# Patient Record
Sex: Female | Born: 1975 | Race: Black or African American | Hispanic: No | Marital: Single | Smoking: Never smoker
Health system: Southern US, Community
[De-identification: ages and names within clinical notes are randomized; demographics above are authoritative.]

## PROBLEM LIST (undated history)

## (undated) DIAGNOSIS — N920 Excessive and frequent menstruation with regular cycle: Secondary | ICD-10-CM

## (undated) DIAGNOSIS — O009 Unspecified ectopic pregnancy without intrauterine pregnancy: Secondary | ICD-10-CM

## (undated) DIAGNOSIS — R896 Abnormal cytological findings in specimens from other organs, systems and tissues: Secondary | ICD-10-CM

## (undated) DIAGNOSIS — D509 Iron deficiency anemia, unspecified: Secondary | ICD-10-CM

## (undated) DIAGNOSIS — A599 Trichomoniasis, unspecified: Secondary | ICD-10-CM

## (undated) HISTORY — PX: UNILATERAL SALPINGECTOMY: SHX6160

## (undated) HISTORY — DX: Trichomoniasis, unspecified: A59.9

## (undated) HISTORY — DX: Unspecified ectopic pregnancy without intrauterine pregnancy: O00.90

## (undated) HISTORY — PX: OTHER SURGICAL HISTORY: SHX169

## (undated) HISTORY — DX: Iron deficiency anemia, unspecified: D50.9

## (undated) HISTORY — DX: Abnormal cytological findings in specimens from other organs, systems and tissues: R89.6

## (undated) HISTORY — DX: Excessive and frequent menstruation with regular cycle: N92.0

---

## 1997-07-02 DIAGNOSIS — IMO0001 Reserved for inherently not codable concepts without codable children: Secondary | ICD-10-CM

## 1997-07-02 HISTORY — DX: Reserved for inherently not codable concepts without codable children: IMO0001

## 1997-08-25 ENCOUNTER — Inpatient Hospital Stay (HOSPITAL_COMMUNITY): Admission: AD | Admit: 1997-08-25 | Discharge: 1997-08-26 | Payer: Self-pay | Admitting: *Deleted

## 1997-10-05 ENCOUNTER — Encounter: Admission: RE | Admit: 1997-10-05 | Discharge: 1997-10-05 | Payer: Self-pay | Admitting: Obstetrics & Gynecology

## 1997-10-18 ENCOUNTER — Ambulatory Visit (HOSPITAL_COMMUNITY): Admission: RE | Admit: 1997-10-18 | Discharge: 1997-10-18 | Payer: Self-pay | Admitting: Obstetrics & Gynecology

## 1997-11-08 ENCOUNTER — Encounter: Admission: RE | Admit: 1997-11-08 | Discharge: 1997-11-08 | Payer: Self-pay | Admitting: Family Medicine

## 1997-11-16 ENCOUNTER — Encounter: Admission: RE | Admit: 1997-11-16 | Discharge: 1997-11-16 | Payer: Self-pay | Admitting: Family Medicine

## 1998-03-21 ENCOUNTER — Encounter: Admission: RE | Admit: 1998-03-21 | Discharge: 1998-03-21 | Payer: Self-pay | Admitting: Family Medicine

## 1998-03-28 ENCOUNTER — Encounter: Admission: RE | Admit: 1998-03-28 | Discharge: 1998-03-28 | Payer: Self-pay | Admitting: Family Medicine

## 1998-06-10 ENCOUNTER — Other Ambulatory Visit: Admission: RE | Admit: 1998-06-10 | Discharge: 1998-06-10 | Payer: Self-pay

## 1998-06-10 ENCOUNTER — Encounter: Admission: RE | Admit: 1998-06-10 | Discharge: 1998-06-10 | Payer: Self-pay | Admitting: Family Medicine

## 1998-07-02 DIAGNOSIS — O009 Unspecified ectopic pregnancy without intrauterine pregnancy: Secondary | ICD-10-CM

## 1998-07-02 HISTORY — DX: Unspecified ectopic pregnancy without intrauterine pregnancy: O00.90

## 1999-02-06 ENCOUNTER — Emergency Department (HOSPITAL_COMMUNITY): Admission: EM | Admit: 1999-02-06 | Discharge: 1999-02-06 | Payer: Self-pay | Admitting: Emergency Medicine

## 1999-09-05 ENCOUNTER — Inpatient Hospital Stay (HOSPITAL_COMMUNITY): Admission: AD | Admit: 1999-09-05 | Discharge: 1999-09-08 | Payer: Self-pay | Admitting: Obstetrics and Gynecology

## 1999-09-05 ENCOUNTER — Encounter: Payer: Self-pay | Admitting: Emergency Medicine

## 1999-09-05 ENCOUNTER — Encounter (INDEPENDENT_AMBULATORY_CARE_PROVIDER_SITE_OTHER): Payer: Self-pay

## 2000-04-29 ENCOUNTER — Encounter: Admission: RE | Admit: 2000-04-29 | Discharge: 2000-04-29 | Payer: Self-pay | Admitting: Family Medicine

## 2000-12-03 ENCOUNTER — Encounter: Admission: RE | Admit: 2000-12-03 | Discharge: 2000-12-03 | Payer: Self-pay | Admitting: Family Medicine

## 2001-07-02 HISTORY — PX: TUBAL LIGATION: SHX77

## 2001-07-02 HISTORY — PX: OTHER SURGICAL HISTORY: SHX169

## 2001-11-26 ENCOUNTER — Encounter: Admission: RE | Admit: 2001-11-26 | Discharge: 2001-11-26 | Payer: Self-pay | Admitting: Family Medicine

## 2001-12-03 ENCOUNTER — Encounter: Admission: RE | Admit: 2001-12-03 | Discharge: 2001-12-03 | Payer: Self-pay | Admitting: Family Medicine

## 2001-12-08 ENCOUNTER — Encounter: Admission: RE | Admit: 2001-12-08 | Discharge: 2001-12-08 | Payer: Self-pay | Admitting: Family Medicine

## 2001-12-19 ENCOUNTER — Ambulatory Visit (HOSPITAL_COMMUNITY): Admission: RE | Admit: 2001-12-19 | Discharge: 2001-12-19 | Payer: Self-pay | Admitting: Family Medicine

## 2001-12-19 ENCOUNTER — Encounter: Admission: RE | Admit: 2001-12-19 | Discharge: 2001-12-19 | Payer: Self-pay | Admitting: Family Medicine

## 2002-01-06 ENCOUNTER — Encounter: Admission: RE | Admit: 2002-01-06 | Discharge: 2002-01-06 | Payer: Self-pay | Admitting: Family Medicine

## 2002-01-14 ENCOUNTER — Ambulatory Visit (HOSPITAL_COMMUNITY): Admission: RE | Admit: 2002-01-14 | Discharge: 2002-01-14 | Payer: Self-pay | Admitting: Family Medicine

## 2002-02-06 ENCOUNTER — Encounter: Admission: RE | Admit: 2002-02-06 | Discharge: 2002-02-06 | Payer: Self-pay | Admitting: Family Medicine

## 2002-03-09 ENCOUNTER — Encounter: Admission: RE | Admit: 2002-03-09 | Discharge: 2002-03-09 | Payer: Self-pay | Admitting: Family Medicine

## 2002-04-16 ENCOUNTER — Encounter: Admission: RE | Admit: 2002-04-16 | Discharge: 2002-04-16 | Payer: Self-pay | Admitting: Family Medicine

## 2002-05-01 ENCOUNTER — Encounter: Admission: RE | Admit: 2002-05-01 | Discharge: 2002-05-01 | Payer: Self-pay | Admitting: Family Medicine

## 2002-05-11 ENCOUNTER — Encounter: Admission: RE | Admit: 2002-05-11 | Discharge: 2002-05-11 | Payer: Self-pay | Admitting: Family Medicine

## 2002-05-26 ENCOUNTER — Encounter: Admission: RE | Admit: 2002-05-26 | Discharge: 2002-05-26 | Payer: Self-pay | Admitting: Family Medicine

## 2002-06-03 ENCOUNTER — Encounter: Admission: RE | Admit: 2002-06-03 | Discharge: 2002-06-03 | Payer: Self-pay | Admitting: Family Medicine

## 2002-06-11 ENCOUNTER — Encounter: Admission: RE | Admit: 2002-06-11 | Discharge: 2002-06-11 | Payer: Self-pay | Admitting: Family Medicine

## 2002-06-12 ENCOUNTER — Inpatient Hospital Stay (HOSPITAL_COMMUNITY): Admission: AD | Admit: 2002-06-12 | Discharge: 2002-06-12 | Payer: Self-pay | Admitting: Family Medicine

## 2002-06-15 ENCOUNTER — Inpatient Hospital Stay (HOSPITAL_COMMUNITY): Admission: AD | Admit: 2002-06-15 | Discharge: 2002-06-17 | Payer: Self-pay | Admitting: Family Medicine

## 2002-06-16 ENCOUNTER — Encounter (INDEPENDENT_AMBULATORY_CARE_PROVIDER_SITE_OTHER): Payer: Self-pay | Admitting: Specialist

## 2002-08-07 ENCOUNTER — Encounter: Admission: RE | Admit: 2002-08-07 | Discharge: 2002-08-07 | Payer: Self-pay | Admitting: Family Medicine

## 2003-07-03 DIAGNOSIS — N920 Excessive and frequent menstruation with regular cycle: Secondary | ICD-10-CM

## 2003-07-03 HISTORY — DX: Excessive and frequent menstruation with regular cycle: N92.0

## 2003-07-23 ENCOUNTER — Encounter: Admission: RE | Admit: 2003-07-23 | Discharge: 2003-07-23 | Payer: Self-pay | Admitting: Family Medicine

## 2003-07-27 ENCOUNTER — Encounter (INDEPENDENT_AMBULATORY_CARE_PROVIDER_SITE_OTHER): Payer: Self-pay | Admitting: *Deleted

## 2003-07-27 ENCOUNTER — Encounter: Admission: RE | Admit: 2003-07-27 | Discharge: 2003-07-27 | Payer: Self-pay | Admitting: Family Medicine

## 2003-07-27 ENCOUNTER — Other Ambulatory Visit: Admission: RE | Admit: 2003-07-27 | Discharge: 2003-07-27 | Payer: Self-pay | Admitting: Family Medicine

## 2003-08-11 ENCOUNTER — Encounter: Admission: RE | Admit: 2003-08-11 | Discharge: 2003-08-11 | Payer: Self-pay | Admitting: Family Medicine

## 2003-10-28 ENCOUNTER — Emergency Department (HOSPITAL_COMMUNITY): Admission: EM | Admit: 2003-10-28 | Discharge: 2003-10-28 | Payer: Self-pay

## 2004-01-17 ENCOUNTER — Encounter: Admission: RE | Admit: 2004-01-17 | Discharge: 2004-01-17 | Payer: Self-pay | Admitting: Family Medicine

## 2004-02-02 ENCOUNTER — Encounter: Admission: RE | Admit: 2004-02-02 | Discharge: 2004-02-02 | Payer: Self-pay | Admitting: Sports Medicine

## 2004-08-28 ENCOUNTER — Ambulatory Visit: Payer: Self-pay | Admitting: Sports Medicine

## 2004-10-26 ENCOUNTER — Ambulatory Visit: Payer: Self-pay | Admitting: Family Medicine

## 2005-01-24 ENCOUNTER — Ambulatory Visit: Payer: Self-pay | Admitting: Family Medicine

## 2005-01-25 ENCOUNTER — Encounter: Admission: RE | Admit: 2005-01-25 | Discharge: 2005-01-25 | Payer: Self-pay | Admitting: Sports Medicine

## 2005-03-06 ENCOUNTER — Ambulatory Visit: Payer: Self-pay | Admitting: Family Medicine

## 2005-06-20 ENCOUNTER — Ambulatory Visit: Payer: Self-pay | Admitting: Family Medicine

## 2005-08-31 ENCOUNTER — Encounter (INDEPENDENT_AMBULATORY_CARE_PROVIDER_SITE_OTHER): Payer: Self-pay | Admitting: Specialist

## 2005-08-31 ENCOUNTER — Other Ambulatory Visit: Admission: RE | Admit: 2005-08-31 | Discharge: 2005-08-31 | Payer: Self-pay | Admitting: Family Medicine

## 2005-08-31 ENCOUNTER — Ambulatory Visit: Payer: Self-pay | Admitting: Sports Medicine

## 2005-10-03 ENCOUNTER — Ambulatory Visit: Payer: Self-pay | Admitting: Family Medicine

## 2005-12-25 ENCOUNTER — Ambulatory Visit: Payer: Self-pay | Admitting: Family Medicine

## 2005-12-25 ENCOUNTER — Encounter (INDEPENDENT_AMBULATORY_CARE_PROVIDER_SITE_OTHER): Payer: Self-pay | Admitting: Specialist

## 2006-01-09 ENCOUNTER — Ambulatory Visit: Payer: Self-pay | Admitting: Family Medicine

## 2006-01-14 ENCOUNTER — Ambulatory Visit: Payer: Self-pay | Admitting: Family Medicine

## 2006-01-21 ENCOUNTER — Ambulatory Visit: Payer: Self-pay | Admitting: Family Medicine

## 2006-05-18 ENCOUNTER — Emergency Department (HOSPITAL_COMMUNITY): Admission: EM | Admit: 2006-05-18 | Discharge: 2006-05-18 | Payer: Self-pay | Admitting: Emergency Medicine

## 2006-05-20 ENCOUNTER — Ambulatory Visit: Payer: Self-pay | Admitting: Sports Medicine

## 2006-07-02 DIAGNOSIS — A599 Trichomoniasis, unspecified: Secondary | ICD-10-CM

## 2006-07-02 HISTORY — DX: Trichomoniasis, unspecified: A59.9

## 2006-07-29 ENCOUNTER — Other Ambulatory Visit: Admission: RE | Admit: 2006-07-29 | Discharge: 2006-07-29 | Payer: Self-pay | Admitting: Family Medicine

## 2006-07-29 ENCOUNTER — Ambulatory Visit: Payer: Self-pay | Admitting: Family Medicine

## 2006-08-09 ENCOUNTER — Encounter (INDEPENDENT_AMBULATORY_CARE_PROVIDER_SITE_OTHER): Payer: Self-pay | Admitting: *Deleted

## 2006-08-09 LAB — CONVERTED CEMR LAB

## 2006-08-29 DIAGNOSIS — D509 Iron deficiency anemia, unspecified: Secondary | ICD-10-CM | POA: Insufficient documentation

## 2006-08-29 DIAGNOSIS — N920 Excessive and frequent menstruation with regular cycle: Secondary | ICD-10-CM

## 2006-08-29 HISTORY — DX: Excessive and frequent menstruation with regular cycle: N92.0

## 2006-08-29 HISTORY — DX: Iron deficiency anemia, unspecified: D50.9

## 2006-08-30 ENCOUNTER — Encounter (INDEPENDENT_AMBULATORY_CARE_PROVIDER_SITE_OTHER): Payer: Self-pay | Admitting: *Deleted

## 2006-10-15 ENCOUNTER — Encounter: Payer: Self-pay | Admitting: Family Medicine

## 2006-10-15 ENCOUNTER — Ambulatory Visit: Payer: Self-pay | Admitting: Family Medicine

## 2006-10-15 LAB — CONVERTED CEMR LAB
HCT: 30.8 %
Hemoglobin: 9.6 g/dL
MCV: 63.8 fL
Platelets: 328 10*3/uL
RBC: 4.83 M/uL
WBC: 5.4 10*3/uL
Whiff Test: NEGATIVE

## 2006-10-16 ENCOUNTER — Encounter: Payer: Self-pay | Admitting: Family Medicine

## 2006-11-21 ENCOUNTER — Emergency Department (HOSPITAL_COMMUNITY): Admission: EM | Admit: 2006-11-21 | Discharge: 2006-11-21 | Payer: Self-pay | Admitting: Emergency Medicine

## 2006-12-16 ENCOUNTER — Ambulatory Visit: Payer: Self-pay | Admitting: Family Medicine

## 2007-01-09 ENCOUNTER — Telehealth: Payer: Self-pay | Admitting: *Deleted

## 2007-03-06 IMAGING — CT CT ABDOMEN W/ CM
1 of 2 series · 15 of 32 positions shown, 19 images · IV contrast (GASTROGRAFIN & BAROCAT & WATER)
Comparison: none

CLINICAL DATA: Epigastric pain.  Nausea and vomiting for six weeks.  Question pancreatitis. 
ABDOMINAL CT WITH CONTRAST:
TECHNIQUE: Multidetector CT imaging of the abdomen was performed following the standard protocol during bolus administration of intravenous contrast.
Contrast:  100 cc Omnipaque 300
Comparison [REDACTED] lumbar spine radiographs, 10/28/03. 
The abdominal parenchymal organs are normal in appearance. There is no evidence of masses, adenopathy, inflammatory process, or abnormal fluid collections.  Pancreatic measurements include 2.6 cm AP head, 2.1 cm AP proximal body and AP tail of 2.8 cm (images 28, 24, 20).  No CT evidence for pancreatitis is seen.
Moderate retained colonic feces suggests constipation.
TECHNIQUE: Multidetector CT imaging of the pelvis was performed following the standard protocol during bolus administration of intravenous contrast.

[Series 2: a&p w/ · axial · 0.55mm/px · z∈[-401,-26]mm · 15 of 83 slices shown, 19 images]
[im 4/83  soft-tissue]
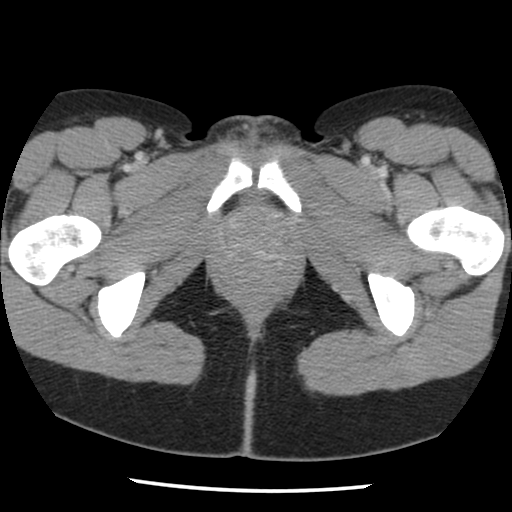
[im 4/83  bone]
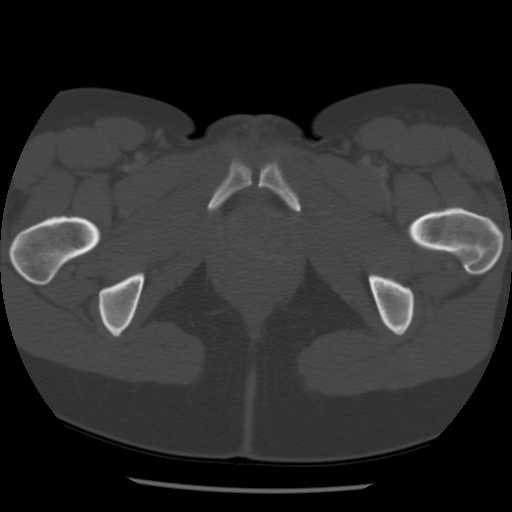
[im 11/83  soft-tissue]
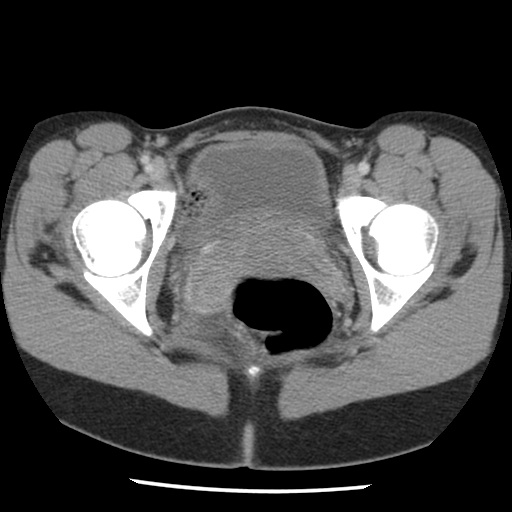
[im 18/83  soft-tissue]
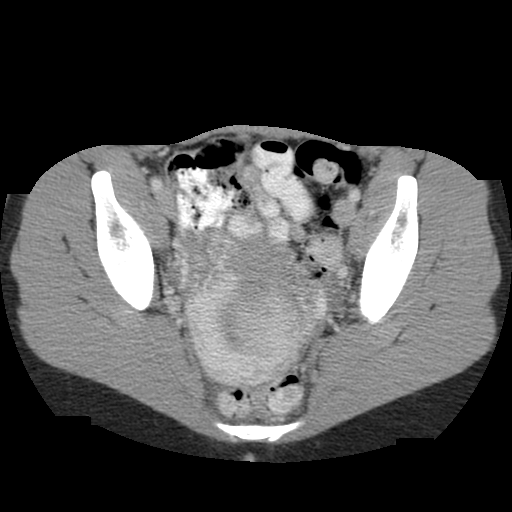
[im 22/83  soft-tissue]
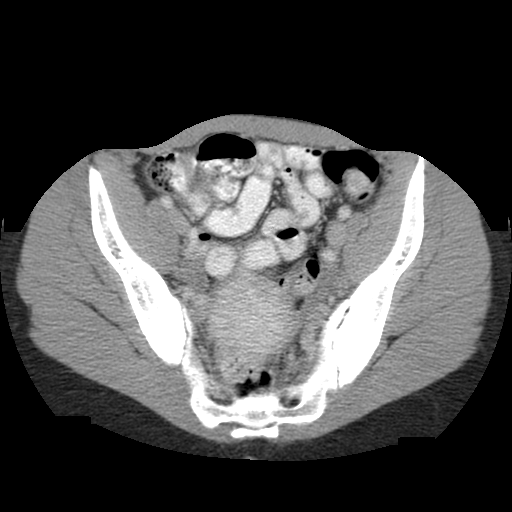
[im 29/83  soft-tissue]
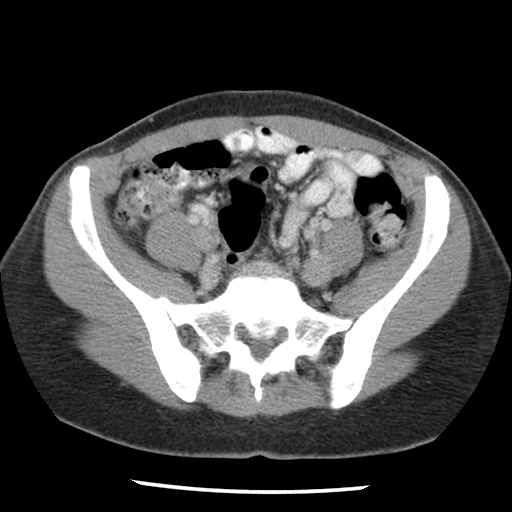
[im 36/83  soft-tissue]
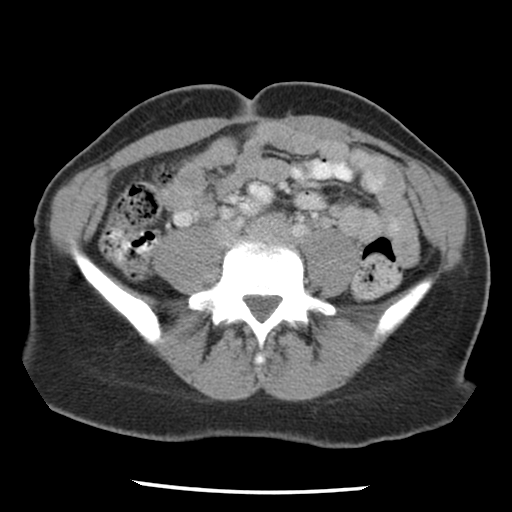
[im 43/83  soft-tissue]
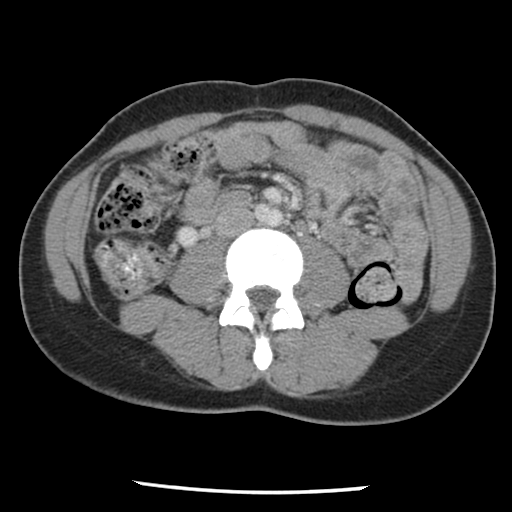
[im 47/83  soft-tissue]
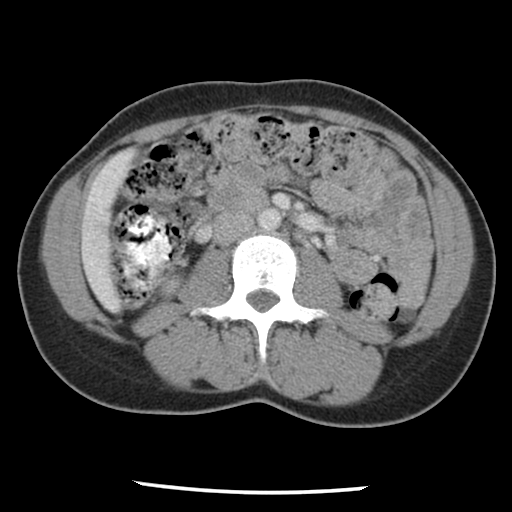
[im 54/83  soft-tissue]
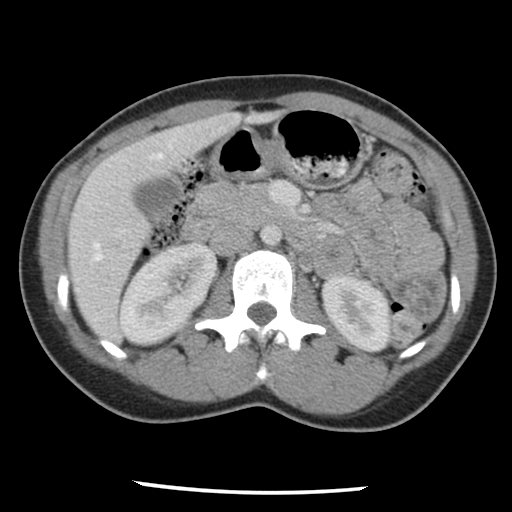
[im 54/83  bone]
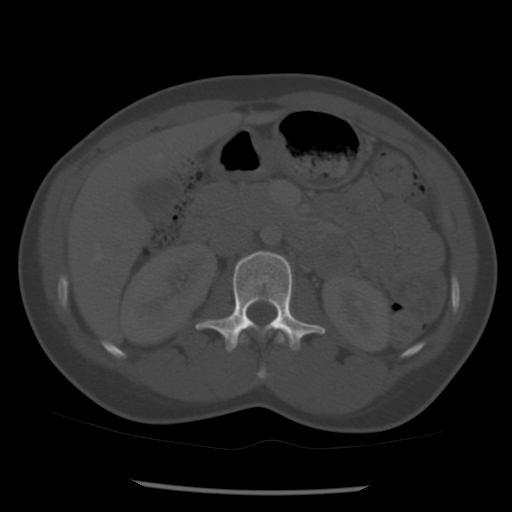
[im 61/83  soft-tissue]
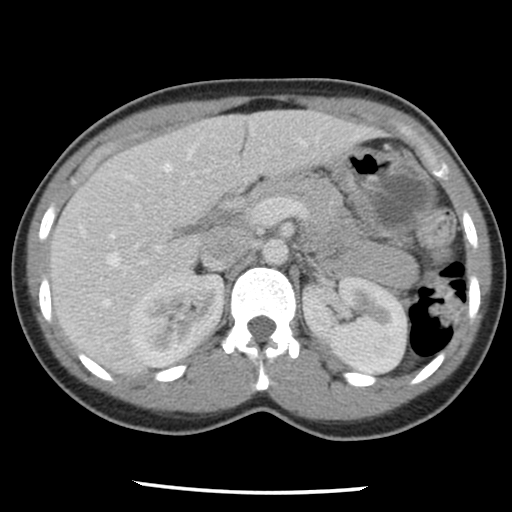
[im 65/83  soft-tissue]
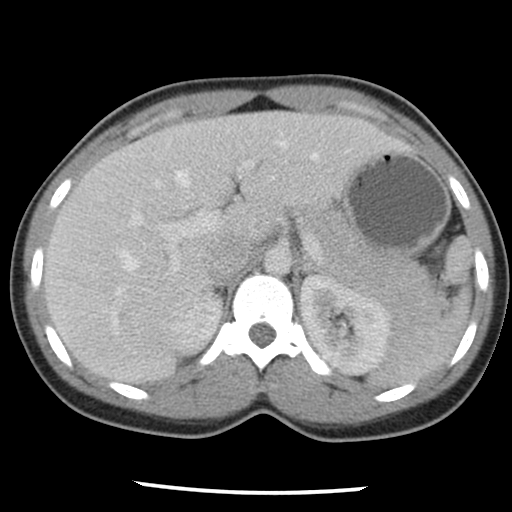
[im 68/83  lung]
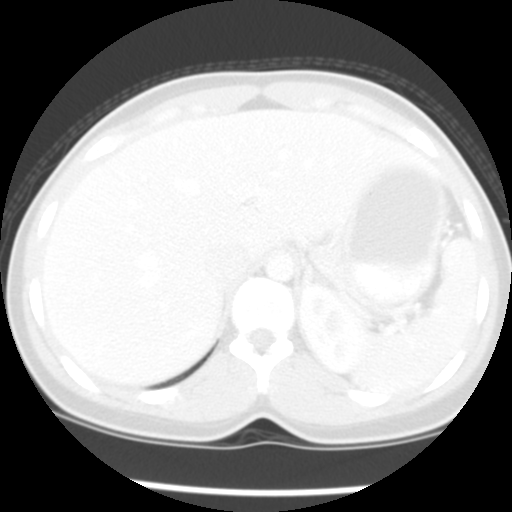
[im 72/83  soft-tissue]
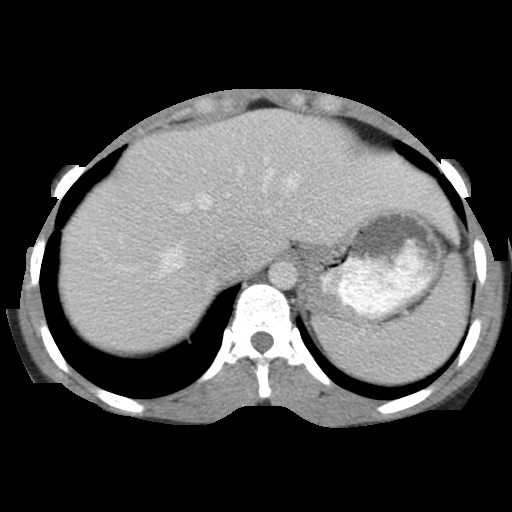
[im 72/83  lung]
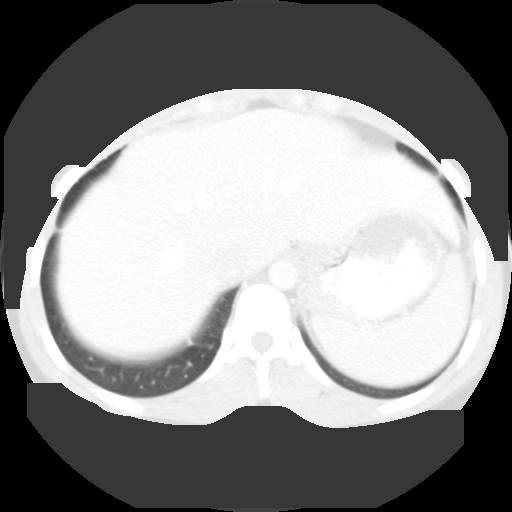
[im 75/83  lung]
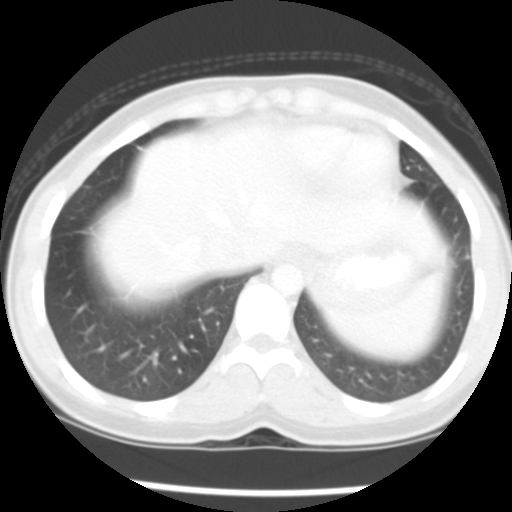
[im 79/83  soft-tissue]
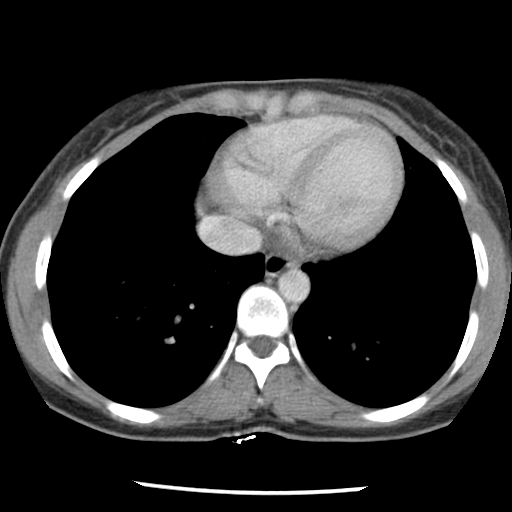
[im 79/83  lung]
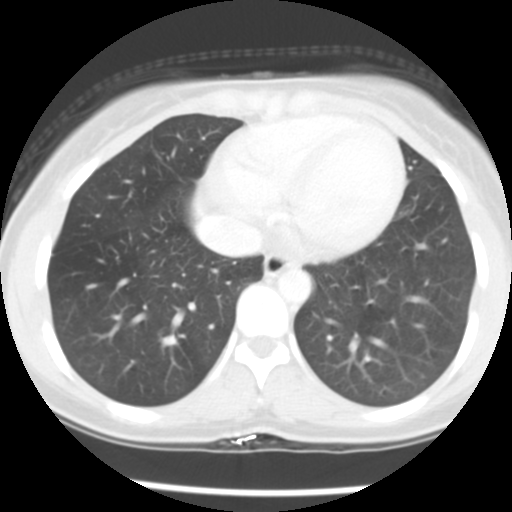

[15 of 32 positions shown; findings below may reference images not displayed]

IMPRESSION: 1.  Probable constipation.
2.  Otherwise negative ? no CT evidence for pancreatitis.
PELVIS CT WITH CONTRAST:
FINDINGS: Retroverted uterus is seen with the prominence of the endometrium probably related to phase of menstrual cycle.   Possible subtle fundal fibroid changes are seen.  Slight cul de sac free fluid is noted (consistent with ovulation or leaking/ruptured ovarian cyst) with no significant adnexal cystic masses by CT assessment.  Moderate retained colonic feces is consistent with constipation.  Normal appearing air-filled appendix is seen with the remaining pelvic structures appearing normal without adenopathy.
IMPRESSION: 1.  Retroverted uterus with prominent endometrial contents ? probably related to phase of menstrual cycle with possible subtle fundal fibroid changes and slight cul de sac free fluid (see findings).  Pelvic ultrasound may be contributory if clinically indicated. 
2.  Probable constipation.  
3.  Otherwise negative.

## 2007-03-26 ENCOUNTER — Telehealth (INDEPENDENT_AMBULATORY_CARE_PROVIDER_SITE_OTHER): Payer: Self-pay | Admitting: *Deleted

## 2007-03-26 ENCOUNTER — Ambulatory Visit: Payer: Self-pay | Admitting: Family Medicine

## 2007-03-26 ENCOUNTER — Encounter (INDEPENDENT_AMBULATORY_CARE_PROVIDER_SITE_OTHER): Payer: Self-pay | Admitting: *Deleted

## 2007-03-26 LAB — CONVERTED CEMR LAB: Whiff Test: POSITIVE

## 2007-03-27 LAB — CONVERTED CEMR LAB
Chlamydia, DNA Probe: NEGATIVE
GC Probe Amp, Genital: NEGATIVE

## 2007-04-07 ENCOUNTER — Telehealth: Payer: Self-pay | Admitting: *Deleted

## 2007-09-22 ENCOUNTER — Emergency Department (HOSPITAL_COMMUNITY): Admission: EM | Admit: 2007-09-22 | Discharge: 2007-09-22 | Payer: Self-pay | Admitting: Emergency Medicine

## 2007-10-14 ENCOUNTER — Telehealth (INDEPENDENT_AMBULATORY_CARE_PROVIDER_SITE_OTHER): Payer: Self-pay | Admitting: Family Medicine

## 2007-10-16 ENCOUNTER — Telehealth: Payer: Self-pay | Admitting: Internal Medicine

## 2007-10-20 ENCOUNTER — Telehealth (INDEPENDENT_AMBULATORY_CARE_PROVIDER_SITE_OTHER): Payer: Self-pay | Admitting: Family Medicine

## 2008-01-06 ENCOUNTER — Ambulatory Visit: Payer: Self-pay | Admitting: Family Medicine

## 2008-01-06 ENCOUNTER — Encounter: Payer: Self-pay | Admitting: Family Medicine

## 2008-01-06 LAB — CONVERTED CEMR LAB: Pap Smear: NORMAL

## 2008-01-07 LAB — CONVERTED CEMR LAB
Eosinophils Absolute: 0.1 10*3/uL (ref 0.0–0.7)
Lymphocytes Relative: 33 % (ref 12–46)
Lymphs Abs: 1.8 10*3/uL (ref 0.7–4.0)
Neutro Abs: 3 10*3/uL (ref 1.7–7.7)
Neutrophils Relative %: 56 % (ref 43–77)
Platelets: 276 10*3/uL (ref 150–400)
WBC: 5.3 10*3/uL (ref 4.0–10.5)

## 2008-01-11 ENCOUNTER — Encounter: Payer: Self-pay | Admitting: Family Medicine

## 2008-07-16 ENCOUNTER — Encounter: Payer: Self-pay | Admitting: Family Medicine

## 2008-07-16 ENCOUNTER — Ambulatory Visit: Payer: Self-pay | Admitting: Family Medicine

## 2008-07-16 LAB — CONVERTED CEMR LAB: Rapid Strep: POSITIVE

## 2008-07-19 ENCOUNTER — Encounter: Payer: Self-pay | Admitting: Family Medicine

## 2008-07-19 LAB — CONVERTED CEMR LAB
Hemoglobin: 10.1 g/dL — ABNORMAL LOW (ref 12.0–15.0)
Iron: 12 ug/dL — ABNORMAL LOW (ref 42–145)
MCHC: 30.1 g/dL (ref 30.0–36.0)
RDW: 18 % — ABNORMAL HIGH (ref 11.5–15.5)
TIBC: 399 ug/dL (ref 250–470)
UIBC: 387 ug/dL

## 2008-10-28 ENCOUNTER — Telehealth: Payer: Self-pay | Admitting: *Deleted

## 2008-12-12 ENCOUNTER — Emergency Department (HOSPITAL_COMMUNITY): Admission: EM | Admit: 2008-12-12 | Discharge: 2008-12-12 | Payer: Self-pay | Admitting: Emergency Medicine

## 2008-12-20 ENCOUNTER — Telehealth: Payer: Self-pay | Admitting: Family Medicine

## 2008-12-21 ENCOUNTER — Ambulatory Visit: Payer: Self-pay | Admitting: Family Medicine

## 2008-12-21 ENCOUNTER — Encounter (INDEPENDENT_AMBULATORY_CARE_PROVIDER_SITE_OTHER): Payer: Self-pay | Admitting: *Deleted

## 2008-12-21 LAB — CONVERTED CEMR LAB: Beta hcg, urine, semiquantitative: NEGATIVE

## 2009-03-24 ENCOUNTER — Ambulatory Visit: Payer: Self-pay | Admitting: Family Medicine

## 2009-03-24 ENCOUNTER — Encounter: Payer: Self-pay | Admitting: Family Medicine

## 2009-03-28 ENCOUNTER — Encounter: Payer: Self-pay | Admitting: Family Medicine

## 2009-03-28 LAB — CONVERTED CEMR LAB
MCHC: 30.4 g/dL (ref 30.0–36.0)
MCV: 70.1 fL — ABNORMAL LOW (ref 78.0–100.0)
RBC: 4.51 M/uL (ref 3.87–5.11)

## 2010-06-19 ENCOUNTER — Telehealth (INDEPENDENT_AMBULATORY_CARE_PROVIDER_SITE_OTHER): Payer: Self-pay | Admitting: *Deleted

## 2010-07-14 ENCOUNTER — Ambulatory Visit: Admission: RE | Admit: 2010-07-14 | Discharge: 2010-07-14 | Payer: Self-pay | Source: Home / Self Care

## 2010-07-14 ENCOUNTER — Encounter: Payer: Self-pay | Admitting: Family Medicine

## 2010-07-14 LAB — CONVERTED CEMR LAB
Basophils Absolute: 0.1 10*3/uL (ref 0.0–0.1)
Basophils Relative: 1 % (ref 0–1)
Eosinophils Absolute: 0.1 10*3/uL (ref 0.0–0.7)
Lymphs Abs: 2.3 10*3/uL (ref 0.7–4.0)
MCHC: 31.8 g/dL (ref 30.0–36.0)
MCV: 73.5 fL — ABNORMAL LOW (ref 78.0–100.0)
Neutrophils Relative %: 54 % (ref 43–77)
Platelets: 234 10*3/uL (ref 150–400)
WBC: 6.3 10*3/uL (ref 4.0–10.5)

## 2010-07-18 ENCOUNTER — Encounter: Payer: Self-pay | Admitting: Family Medicine

## 2010-07-21 ENCOUNTER — Ambulatory Visit: Admission: RE | Admit: 2010-07-21 | Discharge: 2010-07-21 | Payer: Self-pay | Source: Home / Self Care

## 2010-07-25 ENCOUNTER — Telehealth: Payer: Self-pay | Admitting: Family Medicine

## 2010-08-03 NOTE — Progress Notes (Signed)
Summary: refill   Phone Note Refill Request Call back at Home Phone [phone] Message from:  Patient  Refills Requested: Medication #1:  ORTHO TRI-CYCLEN (28) 0.035 MG  TABS take as directed. [facility] [address]  Next Appointment Scheduled: 07/14/10 Initial call taken by: De Nurse,  June 19, 2010 10:20 AM  Follow-up for Phone Call        Rx sent electronically. Follow-up by: Theresia Lo RN,  June 19, 2010 1:42 PM

## 2010-08-03 NOTE — Letter (Signed)
Summary: Results Follow-up Letter  [facility]  [address]   [place], [place] [address]   Phone: [phone]  Fax: [phone]    07/18/2010  [address] [place], [place]  [address]  Dear Ms. Orvan Falconer,   The following are the results of your recent test(s):  Tests: (1) CBC with Diff (10010)   WBC                       6.3 K/uL                    4.0-10.5   RBC                       4.87 MIL/uL                 3.87-5.11   Hemoglobin           [L]  11.4 g/dL                   57.8-46.9   Hematocrit           [L]  35.8 %                      36.0-46.0   MCV                  [L]  73.5 fL                     78.0-100.0 ! MCH                  [L]  23.4 pg                     26.0-34.0   MCHC                      31.8 g/dL                   62.9-52.8   RDW                  [H]  17.2 %                      11.5-15.5  This shows that your red blood cells are a little low, so you should continue your iron pills.  They are only a little low, though, so we do not need to do anything else right now.  Let me know if you have any concerns.  Sincerely,  Majel Homer MD [facility]           Appended Document: Results Follow-up Letter mailed

## 2010-08-03 NOTE — Assessment & Plan Note (Signed)
Summary: cpe,df   Vital Signs:  Patient profile:   35 year old female Height:      64 inches Weight:      124.9 pounds BMI:     21.52 Temp:     98.0 degrees F oral Pulse rate:   84 / minute BP sitting:   117 / 81  (left arm) Cuff size:   regular  Vitals Entered By: Jimmy Footman, CMA (July 14, 2010 2:58 PM) CC: cpe  w/o pap Is Patient Diabetic? No Pain Assessment Patient in pain? no        Primary Provider:  Majel Homer MD  CC:  cpe  w/o pap.  History of Present Illness: Pt doing well.  No active complaints.  Preventive Screening-Counseling & Management  Alcohol-Tobacco     Smoking Status: never  Current Problems (verified): 1)  Menorrhagia  (ICD-626.2) 2)  Anemia, Iron Deficiency, Unspec.  (ICD-280.9)  Current Medications (verified): 1)  Ortho Tri-Cyclen (28) 0.035 Mg  Tabs (Norgestimate-Ethinyl Estradiol) .... Take As Directed. 2)  Ferrous Sulfate 325 (65 Fe) Mg  Tabs (Ferrous Sulfate) .... Take One Tablet  Two Times A Day  Allergies (verified): No Known Drug Allergies  Past History:  Past Medical History: Last updated: 01/06/2008 1. Hx of ASCUS 1999 2. Hx of Ectopic pregnancy 2000 3. Menorrhagia- 2005 4. Hx of nl abd/pelvic CT 2006 5. Tichomonas 2008  Past Surgical History: Last updated: 01/06/2008 1. 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  Three kids ages 30, 69, 43 all boys.  No tobacco, occ etoh, no drugs. Sometimes exercises.  Risk Factors: Smoking Status: never (07/14/2010)  Family History: Reviewed history from 01/06/2008 and no changes required. 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  Three kids ages 108, 4, 81 all boys.  No tobacco, occ etoh, no drugs.  Sometimes exercises.  Review of Systems  The patient denies anorexia, fever, weight loss, weight gain, vision loss, decreased hearing, hoarseness, chest pain, syncope, dyspnea on exertion, peripheral edema, prolonged cough, headaches, hemoptysis, abdominal pain, melena, hematochezia, severe indigestion/heartburn, hematuria, muscle weakness, depression, unusual weight change, and abnormal bleeding.    Physical Exam  General:  Well Head:  Normocephalic and atraumatic without obvious abnormalities. No apparent alopecia or balding. Ears:  External ear exam shows no significant lesions or deformities.  Otoscopic examination reveals clear canals, tympanic membranes are intact bilaterally without bulging, retraction, inflammation or discharge. Hearing is grossly normal bilaterally. Mouth:  Oral mucosa and oropharynx without lesions or exudates.  Teeth in good repair. Neck:  No deformities, masses, or tenderness noted. Lungs:  Normal respiratory effort, chest expands symmetrically. Lungs are clear to auscultation, no crackles or wheezes. Heart:  Normal rate and regular rhythm. S1 and S2 normal without gallop, murmur, click, rub or other extra sounds. Abdomen:  Bowel sounds positive,abdomen soft and non-tender without masses, organomegaly or hernias noted. Msk:  No deformity or scoliosis noted of thoracic or lumbar spine.   Pulses:  R radial normal, R posterior tibial normal, R dorsalis pedis normal, L radial normal, L posterior tibial normal, and L dorsalis pedis normal.   Extremities:  No clubbing, cyanosis, edema, or deformity noted with normal full range of motion of  all joints.   Neurologic:  alert & oriented X3, cranial nerves II-XII intact, strength normal in all extremities, sensation intact to light touch, and gait normal.   Skin:  Intact without suspicious lesions or rashes Cervical Nodes:  no anterior cervical adenopathy and no posterior cervical adenopathy.   Psych:  Oriented X3, memory  intact for recent and remote, normally interactive, good eye contact, not anxious appearing, not depressed appearing, and not agitated.     Impression & Recommendations:  Problem # 1:  Preventive Health Care (ICD-V70.0)  Pt is doing well, stable social situation, no current concerns.  Pap smear not due for another 2 years.  Orders: [facility] - Est  18-39 yrs ([address])  Problem # 2:  MENORRHAGIA (ICD-626.2) Pt reports that her periods are controlled by OCPs.  Discussed, again, the possibility of an IUD.  Patient will contact us if she is desires one.  Will obtain a CBC today to check on Hbg level.  May need to alter iron supplimentation depending on results.  Her updated medication list for this problem includes:    Ortho Tri-cyclen (28) 0.035 Mg Tabs (Norgestimate-ethinyl estradiol) .Marland Kitchen... Take as directed.  Complete Medication List: 1)  Ortho Tri-cyclen (28) 0.035 Mg Tabs (Norgestimate-ethinyl estradiol) .... Take as directed. 2)  Ferrous Sulfate 325 (65 Fe) Mg Tabs (Ferrous sulfate) .... Take one tablet  two times a day  Other Orders: CBC w/Diff-[facility] (6[address]40)  Patient Instructions: 1)  It was great to see you today! 2)  You can do some reading on IUD's.  They are a possible alternative to your birth control pills for controlling how heavy your periods are. 3)  We will get some labs today to check on your blood counts and see if the amount of iron you are taking is enough.  I will mail you the results. 4)  Try to take some calcium (either a multivitamin or Tums) daily for your bones. 5)  Come back to see me in a year or as needed for anything that comes up.   Orders Added: 1)  CBC w/Diff-[facility] [85025] 2)  [facility] - Est  18-39 yrs [[address]395]       Prevention & Chronic Care Immunizations   Influenza vaccine: Not documented   Influenza vaccine deferral: Refused  (07/14/2010)    Tetanus booster: 07/07/2003: Done.   Tetanus booster due: 07/06/2013    Pneumococcal vaccine: Not  documented  Other Screening   Pap smear: NEGATIVE FOR INTRAEPITHELIAL LESIONS OR MALIGNANCY.  (03/24/2009)   Pap smear action/deferral: Deferred  (07/14/2010)   Pap smear due: 07/14/2012   Smoking status: never  (07/14/2010)  Lipids   Total Cholesterol: Not documented   LDL: Not documented   LDL Direct: Not documented   HDL: Not documented   Triglycerides: Not documented

## 2010-08-03 NOTE — Progress Notes (Signed)
Summary: phn msg   Phone Note Call from Patient Call back at Home Phone [phone]   Caller: Patient Summary of Call: would like to talk to doctor about the depo since insurance won't pay for IUD Initial call taken by: De Nurse,  July 25, 2010 2:42 PM  Follow-up for Phone Call        Explained that all that is required to get [name] is to fill out some forms for scholarship and wait several weeks for the device to arrive.  Pt said that sounded agreeable and will call Shanda Bumps to get paperwork started. Follow-up by: Majel Homer MD,  July 25, 2010 3:03 PM     Appended Document: phn msg wants to speak with Shanda Bumps about paperwork for IUD.   Appended Document: phn msg spoke with pt and placed form for her up front to complete.

## 2010-08-03 NOTE — Assessment & Plan Note (Signed)
Summary: IUD insertion/bmc   Vital Signs:  Patient profile:   35 year old female Height:      64 inches Weight:      125.5 pounds BMI:     21.62 Temp:     98.4 degrees F oral Pulse rate:   105 / minute BP sitting:   141 / 92  (left arm) Cuff size:   regular  Vitals Entered By: Jimmy Footman, CMA (July 21, 2010 3:10 PM) CC: IUD insertion Is Patient Diabetic? No Pain Assessment Patient in pain? no        Primary Provider:  Majel Homer MD  CC:  IUD insertion.  History of Present Illness: Pt presents today to further discuss an IUD insertion.  She has had problems with menorraghia for a number of years and this has been controlled well on OCPs, with cycles happening regularly as long as she is on them.  She has been told in the past that she would be a good candidate for a Mirana and presents today to further discuss this.  She is in a managomous heterosexual relationship, has had no recent STDs, and has had a BTL.  She does not have any known uterine abnormalities that would preclude use of an IUD.  Preventive Screening-Counseling & Management  Alcohol-Tobacco     Smoking Status: never  Allergies: No Known Drug Allergies  Past History:  Past medical, surgical, family and social histories (including risk factors) reviewed for relevance to current acute and chronic problems.  Past Medical History: Reviewed history from 01/06/2008 and no changes required. 1. Hx of ASCUS 1999 2. Hx of Ectopic pregnancy 2000 3. Menorrhagia- 2005 4. Hx of nl abd/pelvic CT 2006 5. Tichomonas 2008  Past Surgical History: Reviewed history from 01/06/2008 and no changes required. 1. BTL- 2003  Family History: Reviewed history from 01/06/2008 and no changes required. 2 bros and 4 sis: all healthy dad: healthy mom: htn  Social History: Reviewed history from 07/14/2010 and no changes required. 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  Three kids ages 64, 23, 28  all boys.  No tobacco, occ etoh, no drugs. Sometimes exercises.  Physical Exam  General:  VSS, well appearing Psych:  Oriented X3, memory intact for recent and remote, normally interactive, good eye contact, not anxious appearing, not depressed appearing, and not agitated.     Impression & Recommendations:  Problem # 1:  MENORRHAGIA (ICD-626.2)  Discussed risks and benefits of IUD.  Reviewed Dr. Veatrice Bourbon notes and relevant history.  Also discussed the process necessary for financial assistence to obtain the IUD.  Pt was not ready to go through the financial assistence process at this time so decided to continue OCPs for now.  Pt given contact info for if she decides she is ready to complete [place] scholarship form.  Her updated medication list for this problem includes:    Ortho Tri-cyclen (28) 0.035 Mg Tabs (Norgestimate-ethinyl estradiol) .Marland Kitchen... Take as directed.  Orders: [facility]- Est Level  3 ([address])  Complete Medication List: 1)  Ortho Tri-cyclen (28) 0.035 Mg Tabs (Norgestimate-ethinyl estradiol) .... Take as directed. 2)  Ferrous Sulfate 325 (65 Fe) Mg Tabs (Ferrous sulfate) .... Take one tablet  two times a day  Patient Instructions: 1)  It was great to see you today! 2)  If you have a few more questions about the IUD, let me know and I'll try to answer them. 3)  If you decide that you definitely  want the IUD, give our clinic a call and say that you want to speak with Shanda Bumps about a [place] scholarship.  She can get you the paperwork to fill out. Prescriptions: ORTHO TRI-CYCLEN (28) 0.035 MG  TABS (NORGESTIMATE-ETHINYL ESTRADIOL) take as directed.  #30 x 11   Entered and Authorized by:   Majel Homer MD   Signed by:   Majel Homer MD on 07/21/2010   Method used:   Electronically to        [facility] [phone]* (retail)       [address]       [place], [place]  [address]       Ph: [phone]       Fax: [phone]   RxID:   [id number]    Orders Added: 1)  [facility]-  Est Level  3 [[address]]

## 2010-08-14 ENCOUNTER — Encounter: Payer: Self-pay | Admitting: *Deleted

## 2010-11-17 NOTE — Op Note (Signed)
[facility]  Patient:    Anna Bradley, Anna Bradley                  MRN: [id number] Proc. Date: 09/05/99 Adm. Date:  [id number] Attending:  Rhina Brackett                           Operative Report  PREOPERATIVE DIAGNOSIS:       Ruptured ectopic gestation.  POSTOPERATIVE DIAGNOSIS:      Ruptured ectopic gestation.  OPERATION:                    Laparotomy with right salpingectomy.  SURGEON:                      Duke Salvia. Marcelle Overlie, M.D.  ASSISTANT:  ANESTHESIA:                   General endotracheal anesthesia.  COMPLICATIONS:                None.  DRAINS:                       Foley catheter.  ESTIMATED BLOOD LOSS:         Including old blood, 1200 cc.  DESCRIPTION OF PROCEDURE:     The patient was taken to the operating room and after an adequate level of general endotracheal anesthesia was obtained with the patient supine, the abdomen was prepped and draped in the usual fashion for sterile abdominal procedures.  Foley catheter was positioned draining clear urine.  A transverse Pfannenstiel incision was made and carried down to the fascia which as incised and extended transversely.  The rectus muscles were divided in the midline. The peritoneum entered superiorly without incident.  A large amount of old blood and clot were suctioned out at that point.  Approximately 600 cc were then suctioned.  Once this was completed, the bowel was packed superiorly out of the  field after a Balfour retractor was positioned and the patient placed in Trendelenburg.  The findings were as follows:  The ectopic gestation had ruptured through the ampulla of the right tube which was swollen with the mass and old blood clot with extruding tissue.  The right ovary appeared to be normal.  The cul-de-sac and anterior space along with the left adnexa were completely normal.  Right tube grasped with Babcock clamps and salpingectomy performed by serial clamping  with  Kelly clamps and ligation in a Heaney fashion with 2-0 Dexon sutures, excising he tube.  This area was inspected carefully and noted to be hemostatic along the suture line.  Copious irrigation was then carried out and blood and clot were removed.  Again the left adnexa was normal.  The right side was reinspected and  noted to be hemostatic.  Prior to closure, sponge, needle, and instrument counts were correct x 2.  She did receive Cefotan 1 gram IV perioperatively.  The rectus muscles were reapproximated in the midline with 2-0 Dexon interrupted sutures. The fascia was closed from laterally to midline on either side with a 0 Dexon running suture.  Clips and Steri-Strips were used on the skin.  She tolerated this well and went to the recovery room in good condition. DD:  09/05/99 TD:  09/05/99 Job: [id number]

## 2010-11-17 NOTE — Op Note (Signed)
   NAMEJUSTEEN, HEHR                     ACCOUNT NO.:  [id number]   MEDICAL RECORD NO.:  [id number]                   PATIENT TYPE:  INP   LOCATION:  9102                                 FACILITY:  [facility]   PHYSICIAN:  Conni Elliot, M.D.             DATE OF BIRTH:  [birth year]   DATE OF PROCEDURE:  06/16/2002  DATE OF DISCHARGE:                                 OPERATIVE REPORT   PREOPERATIVE DIAGNOSES:  1. Desire for surgical sterilization.  2. History of right salpingectomy for right ectopic pregnancy.   POSTOPERATIVE DIAGNOSES:  1. Desire for surgical sterilization.  2. History of right salpingectomy for right ectopic pregnancy.   PROCEDURE:  Left partial salpingectomy and oversew of right ovary.   SURGEON:  Conni Elliot, M.D.   ANESTHESIA:  Continuous lumbar epidural.   OPERATIVE FINDINGS:  The left fallopian tube appeared normal.  The right  fallopian tube appeared to be totally removed by prior surgery and the right  ovary was normal, however, upon observation it began to have some small  amount of bleeding and as was stated in the operative note, this was  oversewn.   DESCRIPTION OF PROCEDURE:  After placing the patient in continuous epidural  anesthetic, the abdomen was prepped and draped in the sterile fashion.  The  periumbilical incision was made and incision was made in the subcutaneous  fashion, and the cavity was entered.  The left fallopian was identified and  grasped with Babcock clamp and followed to the fimbriated end.  A single-  tooth tenaculum was brought into the operative field, double suture ligated  and approximately 1.5-2.0 cm segment excised.  Hemostasis was adequate.  We  did proceeded to observation of the right side, and it was identified that  the right fallopian tube appeared to be totally transected and removed.  However, during the manipulation, the right ovary began to ooze.  This area  of ooze was identified and oversewn.   Hemostasis was adequate.  Anterior  peritoneum and fascia were closed and subsequently the skin were then closed  in a routine fashion.  Estimated blood loss was less than 100 cc.                                               Conni Elliot, M.D.    ASG/MEDQ  D:  06/16/2002  T:  06/16/2002  Job:  [id number]

## 2010-11-17 NOTE — Discharge Summary (Signed)
[name], [name]                     ACCOUNT NO.:  [id number]   MEDICAL RECORD NO.:  [id number]                   PATIENT TYPE:  INP   LOCATION:  9102                                 FACILITY:  [facility]   PHYSICIAN:  Tanya S. Shawnie Bradley, M.D.                DATE OF BIRTH:  [birth year]   DATE OF ADMISSION:  06/14/2002  DATE OF DISCHARGE:  06/17/2002                                 DISCHARGE SUMMARY   DISCHARGE DIAGNOSES:  1. Febrile illness.  2. Intrauterine pregnancy at 28 and 5/[redacted] weeks gestation.  3. Delivery of a viable female infant.  4. Status post bilateral tubal ligation.  5. Induction of labor secondary to nonreassuring fetal heart tones.   DISCHARGE MEDICATIONS:  1. Ibuprofen 600 mg one tablet p.o. q.6h. p.r.n. pain.  2. Percocet 5/325 one to two tablets p.o. q.6h. p.r.n. stronger pain.  3. Ferrous sulfate 325 one p.o. b.i.d. for three months.  4. Prenatal vitamins one tablet p.o. daily x6 weeks.  5. Simethicone/Gas-X p.r.n. flatus.   FOLLOW UP:  The patient will follow up in six weeks with Viviann Spare A. Waynette Buttery,  M.D. at [facility].   PROCEDURE:  Bilateral tubal ligation by Mary Sella. Orlene Erm, M.D.   HOSPITAL COURSE:  The patient is a 35 year old G4, P2-0-1-2 presenting at 10  and 5/[redacted] weeks gestation secondary to fever, cough, and myalgias.  Upon  evaluation patient was found to be febrile with fetal tachycardia secondary  to maternal fever.  The patient was admitted for 23-hour observation and for  rehydration secondary to current febrile illness.  During the observation  fetus developed variable decelerations which were persistent.  After  discussion with physician team, the patient underwent induction of labor  with artificial rupture of membranes and Pitocin augmentation.  The  patient's subsequently delivered a viable female infant with Apgars 9/1 and  9/5 under epidural anesthesia.  On postpartum day one patient underwent  bilateral tubal ligation by  Mary Sella. Orlene Erm, M.D.  The patient did well in the  postoperative period.  The patient is bottle feeding.  Due to patient's  anemia prior to delivery, patient will be discharged on iron sulfate  therapy.   DISCHARGE RECOMMENDATIONS:  Activity:  No heavy lifting over 10 pounds for  the next two weeks.  Sexual activity:  Nothing per vagina for six weeks.  Diet at home:  Regular.  Wound care:  As outlined by Mary Sella. Orlene Erm, M.D. for  treatment of bilateral tubal ligation incision.  Special instructions:  The  patient is to contact her physician for development of fever, increased  vaginal bleeding, severe abdominal pain uncontrolled by pain medications.     Anna Bradley, M.D.                        Anna Bradley, M.D.    KS/MEDQ  D:  09/03/2002  T:  09/03/2002  Job:  [id number]

## 2010-11-17 NOTE — H&P (Signed)
[facility]  Patient:    Anna Bradley, Anna Bradley                  MRN: [id number] Adm. Date:  [id number] Attending:  Rhina Brackett                         History and Physical  CHIEF COMPLAINT:              Ruptured ectopic pregnancy.  HISTORY OF PRESENT ILLNESS:   A 35 year old G1 P0 presented to the [facility] ER complaining of abdominal pain.  Evaluation there showed a hemoglobin of 6.7, positive pregnancy test.  They went ahead and did an ultrasound, consistent with an ectopic pregnancy and free fluid noted.  She was stabilized with IV fluids and transported per [facility] to [facility], straight to the operating room for surgical treatment.  PAST MEDICAL HISTORY:  ALLERGIES:                    None.  OPERATIONS:                   None.  OBSTETRICAL HISTORY:          Otherwise unremarkable.  REVIEW OF SYSTEMS:            Negative.  MEDICATIONS:                  None.  PHYSICAL EXAMINATION:  VITAL SIGNS:                  Temperature 98.2, blood pressure 115/60, pulse 80.  LABORATORY DATA:              Hemoglobin 6.7, urine pregnancy test positive.  LUNGS:                        Clear.  CARDIOVASCULAR:               Regular rate and rhythm without murmurs, rubs, or  gallops noted.  BREASTS:                      Not examined.  ABDOMEN:                      Soft, flat, tender in the lower quadrants, mild rebound.  PELVIC:                       Per the ER physician at [facility] showed the uterus to be upper limits of normal size, tender bilaterally.  No vaginal bleeding.  Cervix closed.  EXTREMITIES AND NEUROLOGIC:   Unremarkable.  IMPRESSION:                   Ruptured ectopic pregnancy.  PLAN:                         Laparotomy, salpingectomy, possible USO.  This procedure, including risks relative to bleeding, infection, adjacent organ injury, transfusion discussed with the patient, which she understands and accepts. DD:   09/05/99 TD:  09/05/99 Job: [id number]

## 2010-11-17 NOTE — Discharge Summary (Signed)
[facility]  Patient:    Anna Bradley, Anna Bradley                  MRN: [id number] Adm. Date:  [id number] Disc. Date: [id number] Attending:  Rhina Brackett                           Discharge Summary  DISCHARGE DIAGNOSES:          1. Ruptured right ectopic pregnancy.                               2. Laparotomy with right salpingectomy this                                  admission.  HISTORY OF PRESENT ILLNESS:   Please see admission H&P for details.  Briefly, a  35 year old, gravida 1, para 0 patient presented to the [facility] ER with a hemoglobin of 6.7, positive pregnancy test, and ultrasound demonstratining free fluid and ectopic gestation.  HOSPITAL COURSE:              She was stable at that point and transferred to [facility] [place] where she underwent immediate laparotomy and right salpingectomy.  Estimated blood loss at the time of surgery was approximately 1000 to 1200 cc.  Her preoperative hemoglobin was 6.7, the first postoperative day 5.5, second postoperative day was 4.7 and later 4.5 on September 07, 1999.  We were hoping that she would be able to tolerate this significant anemia, but due to continue  problems with orthostasis, she was transfused two units on March 8.  She felt much better the a.m. of March 9, which was postoperative day #3. Hemoglobin after two units of was 7.9.  The incision was clean and dry.  She was tolerating a regular diet, remained afebrile, and was ready for discharge at that point.  Other lab data; UA was negative, pregnancy test was positive.  Blood type was A  positive, antibody screen was negative.  DISPOSITION:                  The patient is discharged on Nu-Iron 150 one p.o.  b.i.d. and will return to our office in one week.  Tylox p.r.n. pain.  Advised o report any incisional redness or drainage, increased pain or bleeding, or fever  over 101.  She was given  specific instructions regarding diet, sex, and exercise.  CONDITION ON DISCHARGE:       Good.  ACTIVITY:                     Graded increase. DD:  09/08/99 TD:  09/09/99 Job: [id number]

## 2011-03-27 LAB — POCT URINALYSIS DIP (DEVICE)
Bilirubin Urine: NEGATIVE
Ketones, ur: NEGATIVE
pH: 6

## 2011-03-27 LAB — URINALYSIS, MICROSCOPIC ONLY
Bilirubin Urine: NEGATIVE
Specific Gravity, Urine: 1.016
Urobilinogen, UA: 0.2
pH: 6

## 2011-03-27 LAB — URINE CULTURE

## 2011-03-27 LAB — POCT I-STAT, CHEM 8
Calcium, Ion: 1.13
Chloride: 104
Creatinine, Ser: 1
Glucose, Bld: 90
HCT: 39

## 2011-03-27 LAB — DIFFERENTIAL
Basophils Absolute: 0
Basophils Relative: 0
Lymphs Abs: 1.6
Monocytes Relative: 4
Neutro Abs: 9.3 — ABNORMAL HIGH

## 2011-03-27 LAB — CBC
HCT: 34.5 — ABNORMAL LOW
Hemoglobin: 11 — ABNORMAL LOW
MCHC: 31.8
RDW: 18.5 — ABNORMAL HIGH

## 2011-08-13 ENCOUNTER — Other Ambulatory Visit: Payer: Self-pay | Admitting: Family Medicine

## 2011-08-13 MED ORDER — NORGESTIM-ETH ESTRAD TRIPHASIC 0.18/0.215/0.25 MG-35 MCG PO TABS: 1.0000 | ORAL_TABLET | ORAL | Status: DC

## 2011-09-03 ENCOUNTER — Other Ambulatory Visit: Payer: Self-pay | Admitting: Family Medicine

## 2011-09-03 ENCOUNTER — Encounter: Payer: Self-pay | Admitting: Family Medicine

## 2011-09-03 MED ORDER — NORGESTIM-ETH ESTRAD TRIPHASIC 0.18/0.215/0.25 MG-35 MCG PO TABS: 1.0000 | ORAL_TABLET | ORAL | Status: DC

## 2012-01-31 ENCOUNTER — Other Ambulatory Visit: Payer: Self-pay | Admitting: Family Medicine

## 2012-01-31 NOTE — Telephone Encounter (Signed)
Will forward message to Dr. Ritch. 

## 2012-01-31 NOTE — Telephone Encounter (Signed)
Pt has been having trouble getting her insurance (company has been switching insurance) - she needs to know if she can get 2 more months worth of BCP and then she can make an appt to see her doctor.  [facility] [address]

## 2012-02-01 MED ORDER — NORGESTIM-ETH ESTRAD TRIPHASIC 0.18/0.215/0.25 MG-35 MCG PO TABS: 1.0000 | ORAL_TABLET | Freq: Every day | ORAL | Status: DC

## 2012-06-18 ENCOUNTER — Other Ambulatory Visit: Payer: Self-pay | Admitting: Family Medicine

## 2012-06-18 MED ORDER — NORGESTIM-ETH ESTRAD TRIPHASIC 0.18/0.215/0.25 MG-35 MCG PO TABS: 1.0000 | ORAL_TABLET | Freq: Every day | ORAL | Status: DC

## 2012-06-18 NOTE — Telephone Encounter (Signed)
Patient has appt on 1/3, but is on the last week of her bc and would like 1 refill to last until her appt.  She uses [facility] [address].

## 2012-06-18 NOTE — Telephone Encounter (Signed)
Will forward to Dr Ritch 

## 2012-07-04 ENCOUNTER — Ambulatory Visit: Payer: Self-pay | Admitting: Family Medicine

## 2012-09-18 ENCOUNTER — Telehealth: Payer: Self-pay | Admitting: Family Medicine

## 2012-09-18 MED ORDER — NORGESTIM-ETH ESTRAD TRIPHASIC 0.18/0.215/0.25 MG-35 MCG PO TABS: 1.0000 | ORAL_TABLET | Freq: Every day | ORAL | Status: DC

## 2012-09-18 NOTE — Telephone Encounter (Signed)
Returned call to patient and left message that we will call her after checking with Dr. Louanne Belton for refill approval.  Request routed to Dr. Louanne Belton.  Gaylene Brooks, RN

## 2012-09-18 NOTE — Telephone Encounter (Signed)
Pt is having a hard time getting her BCP to regulate her periods - she hasn't been here since 07/2010 and has an appt w/ Britta Mccreedy to re certify the orange card - she is asking to have one more pack sent in until she can see Britta Mccreedy on 4/17.  pls advise   [facility] [address]

## 2012-09-19 ENCOUNTER — Telehealth: Payer: Self-pay | Admitting: Family Medicine

## 2012-09-19 NOTE — Telephone Encounter (Signed)
Patient is calling asking for the Birth Control to be resent to [place] at [facility].  It looks like it was sent yesterday, but when she called, they say they haven't received it.

## 2012-09-19 NOTE — Telephone Encounter (Signed)
[facility] pharmacy and Rx is ready for patient to pick up.  Called and informed patient.  Gaylene Brooks, RN

## 2012-10-16 ENCOUNTER — Telehealth: Payer: Self-pay | Admitting: Family Medicine

## 2012-10-16 DIAGNOSIS — K0889 Other specified disorders of teeth and supporting structures: Secondary | ICD-10-CM

## 2012-10-16 NOTE — Telephone Encounter (Signed)
Spoke with patient and told her that we will put in a dental referral for her. The limit has been reached for this month. Referral will be mailed out May 1st. I also explained to patient that it may take 2 months to get an appointment

## 2012-10-16 NOTE — Telephone Encounter (Signed)
Pt needs a referral to a dentist.

## 2012-10-23 ENCOUNTER — Encounter: Payer: Self-pay | Admitting: Family Medicine

## 2012-10-23 ENCOUNTER — Ambulatory Visit (INDEPENDENT_AMBULATORY_CARE_PROVIDER_SITE_OTHER): Payer: No Typology Code available for payment source | Admitting: Family Medicine

## 2012-10-23 VITALS — BP 122/88 | HR 84 | Temp 98.7°F | Ht 65.0 in | Wt 124.0 lb

## 2012-10-23 DIAGNOSIS — Z0000 Encounter for general adult medical examination without abnormal findings: Secondary | ICD-10-CM

## 2012-10-23 DIAGNOSIS — D509 Iron deficiency anemia, unspecified: Secondary | ICD-10-CM

## 2012-10-23 LAB — CBC
Hemoglobin: 10.4 g/dL — ABNORMAL LOW (ref 12.0–15.0)
MCH: 23.4 pg — ABNORMAL LOW (ref 26.0–34.0)
MCV: 70.8 fL — ABNORMAL LOW (ref 78.0–100.0)
Platelets: 231 10*3/uL (ref 150–400)
RBC: 4.45 MIL/uL (ref 3.87–5.11)
WBC: 4.2 10*3/uL (ref 4.0–10.5)

## 2012-10-23 MED ORDER — NORGESTIM-ETH ESTRAD TRIPHASIC 0.18/0.215/0.25 MG-35 MCG PO TABS: 1.0000 | ORAL_TABLET | Freq: Every day | ORAL | Status: DC

## 2012-10-23 NOTE — Patient Instructions (Signed)
It was good to see you today! We will check your iron level today to make sure you are getting enough in your diet. Please get back in some time in the next 6 months for [place] to do your pap smear.

## 2012-10-23 NOTE — Progress Notes (Signed)
Patient ID: Anna Bradley, female   DOB: 08-08-1975, 37 y.o.   MRN: [id number] SUBJECTIVE:  37 y.o. female for annual routine Pap and checkup. Current Outpatient Prescriptions  Medication Sig Dispense Refill  . Norgestimate-Ethinyl Estradiol Triphasic (ORTHO TRI-CYCLEN, 28,) 0.18/0.215/0.25 MG-35 MCG tablet Take 1 tablet by mouth daily.  1 Package  11   No current facility-administered medications for this visit.   Allergies: Review of patient's allergies indicates no known allergies.  No LMP recorded.  ROS:  Feeling well. No dyspnea or chest pain on exertion.  No abdominal pain, change in bowel habits, black or bloody stools.  No urinary tract symptoms. GYN ROS: normal menses, no abnormal bleeding, pelvic pain or discharge, no breast pain or new or enlarging lumps on self exam, she complains of mild nausea some days after taking OCPs in AM. No neurological complaints.  OBJECTIVE:  The patient appears well, alert, oriented x 3, in no distress. BP 122/88  Pulse 84  Temp(Src) 98.7 F (37.1 C) (Oral)  Ht 5\' 5"  (1.651 m)  Wt 124 lb (56.246 kg)  BMI 20.63 kg/m2 ENT normal.  Neck supple. No adenopathy or thyromegaly. PERLA. Lungs are clear, good air entry, no wheezes, rhonchi or rales. S1 and S2 normal, no murmurs, regular rate and rhythm. Abdomen soft without tenderness, guarding, mass or organomegaly. Extremities show no edema, normal peripheral pulses. Neurological is normal, no focal findings.  BREAST EXAM: not examined  PELVIC EXAM: exam declined by the patient  ASSESSMENT:  well woman no contraindication to continue hormonal therapy  PLAN:  Normal pap in past 3 years, no new partners.  Would rec pap sometime in the next 6 months as that would be about 3 years from last.  Check CBC to monitor iron deficiency.  Discussed IUD.  Pt not interested at this time.

## 2013-03-08 ENCOUNTER — Ambulatory Visit (INDEPENDENT_AMBULATORY_CARE_PROVIDER_SITE_OTHER): Payer: Self-pay | Admitting: Family Medicine

## 2013-03-08 ENCOUNTER — Ambulatory Visit: Payer: Self-pay

## 2013-03-08 VITALS — BP 138/88 | HR 112 | Temp 98.8°F | Resp 16 | Ht 64.75 in | Wt 122.0 lb

## 2013-03-08 DIAGNOSIS — S51012A Laceration without foreign body of left elbow, initial encounter: Secondary | ICD-10-CM

## 2013-03-08 DIAGNOSIS — S51009A Unspecified open wound of unspecified elbow, initial encounter: Secondary | ICD-10-CM

## 2013-03-08 DIAGNOSIS — T07XXXA Unspecified multiple injuries, initial encounter: Secondary | ICD-10-CM

## 2013-03-08 DIAGNOSIS — M79609 Pain in unspecified limb: Secondary | ICD-10-CM

## 2013-03-08 MED ORDER — SILVER SULFADIAZINE 1 % EX CREA: TOPICAL_CREAM | Freq: Every day | CUTANEOUS | Status: DC

## 2013-03-08 MED ORDER — HYDROCODONE-ACETAMINOPHEN 5-325 MG PO TABS: 1.0000 | ORAL_TABLET | Freq: Four times a day (QID) | ORAL | Status: DC | PRN
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
## 2013-03-08 NOTE — Patient Instructions (Addendum)
WOUND CARE  Keep area clean and dry for 24 hours. Do not remove bandage, if applied. . After 24 hours, remove bandage and wash wound gently with mild soap and warm water. Reapply a new bandage after cleaning wound, if directed. . Continue daily cleansing with soap and water until stitches/staples are removed. . Do not apply any ointments or creams to the wound while stitches/staples are in place, as this may cause delayed healing. . Notify the office if you experience any of the following signs of infection: Swelling, redness, pus drainage, streaking, fever >101.0 F . Notify the office if you experience excessive bleeding that does not stop after 15-20 minutes of constant, firm pressure.  PLEASE RETURN IN 3 DAYS

## 2013-03-08 NOTE — Addendum Note (Signed)
Addended by: Elvina Sidle on: 03/08/2013 03:40 PM   Modules accepted: Orders

## 2013-05-07 ENCOUNTER — Ambulatory Visit: Payer: Self-pay

## 2013-05-20 ENCOUNTER — Ambulatory Visit: Payer: Self-pay

## 2013-05-21 ENCOUNTER — Ambulatory Visit: Payer: Self-pay

## 2013-08-01 ENCOUNTER — Emergency Department (HOSPITAL_COMMUNITY)
Admission: EM | Admit: 2013-08-01 | Discharge: 2013-08-01 | Disposition: A | Discharge status: Home / Self Care | Payer: BC Managed Care – PPO | Source: Home / Self Care | Attending: Family Medicine | Admitting: Family Medicine

## 2013-08-01 ENCOUNTER — Encounter (HOSPITAL_COMMUNITY): Payer: Self-pay | Admitting: Emergency Medicine

## 2013-08-01 DIAGNOSIS — B349 Viral infection, unspecified: Secondary | ICD-10-CM

## 2013-08-01 DIAGNOSIS — B9789 Other viral agents as the cause of diseases classified elsewhere: Secondary | ICD-10-CM

## 2013-08-01 MED ORDER — DICLOFENAC SODIUM 75 MG PO TBEC: 75.0000 mg | DELAYED_RELEASE_TABLET | Freq: Two times a day (BID) | ORAL | Status: DC | PRN

## 2013-08-01 NOTE — ED Notes (Signed)
Pt c/o constant HA, BA, and chills onset Wednesday Denies: f/v/n/d... Taking nyquil w/temp relief Alert w/no signs of acute distress.

## 2013-08-01 NOTE — ED Provider Notes (Addendum)
Anna Bradley is a 38 y.o. female who presents to Urgent Care today for 4-5 days of mild headache body ache chills. No significant cough congestion nausea vomiting or diarrhea. No ear pain or dysuria. No constipation. Patient has tried ibuprofen which helps a little. Symptoms are moderate.   Past Medical History  Diagnosis Date  . ANEMIA, IRON DEFICIENCY, UNSPEC. 08/29/2006    Qualifier: Diagnosis of  By: Samara Snide    . ASCUS (atypical squamous cells of undetermined significance) on Pap smear 1999  . Ectopic pregnancy 2000  . Menorrhagia 2005    tx with ocps  . Trichomonas 2008   History  Substance Use Topics  . Smoking status: Never Smoker   . Smokeless tobacco: Never Used  . Alcohol Use: No   ROS as above Medications: No current facility-administered medications for this encounter.   Current Outpatient Prescriptions  Medication Sig Dispense Refill  . diclofenac (VOLTAREN) 75 MG EC tablet Take 1 tablet (75 mg total) by mouth 2 (two) times daily as needed.  60 tablet  0  . HYDROcodone-acetaminophen (NORCO) 5-325 MG per tablet Take 1 tablet by mouth every 6 (six) hours as needed for pain.  20 tablet  0  . Norgestimate-Ethinyl Estradiol Triphasic (ORTHO TRI-CYCLEN, 28,) 0.18/0.215/0.25 MG-35 MCG tablet Take 1 tablet by mouth daily.  1 Package  11  . silver sulfADIAZINE (SILVADENE) 1 % cream Apply topically daily.  50 g  0    Exam:  BP 149/98  Pulse 105  Temp(Src) 99.6 F (37.6 C) (Oral)  Resp 19  SpO2 100%  LMP 08/01/2013 Gen: Well NAD HEENT: EOMI,  MMM, PERRLA tympanic membranes are normal appearing bilaterally. Posterior pharynx is nonerythematous. Lungs: Normal work of breathing. CTABL Heart: RRR no MRG Abd: NABS, Soft. NT, ND Exts: Brisk capillary refill, warm and well perfused.  Neuro: Alert and oriented normal coordination balance and gait. Cranial nerves are intact   Assessment and Plan: 38 y.o. female with viral illness. Plan to treat with diclofenac.  Followup with primary care provider.  Discussed warning signs or symptoms. Please see discharge instructions. Patient expresses understanding.    Gregor Hams, MD 08/01/13 [place], MD 08/01/13 [phone]

## 2013-08-01 NOTE — Discharge Instructions (Signed)
Thank you for coming in today. Take diclofenac twice daily as needed for pain fevers or chills. Call or go to the emergency room if you get worse, have trouble breathing, have chest pains, or palpitations.

## 2013-08-19 ENCOUNTER — Ambulatory Visit (INDEPENDENT_AMBULATORY_CARE_PROVIDER_SITE_OTHER): Payer: BC Managed Care – PPO | Admitting: Family Medicine

## 2013-08-19 ENCOUNTER — Encounter: Payer: Self-pay | Admitting: Family Medicine

## 2013-08-19 VITALS — BP 134/82 | HR 94 | Temp 98.9°F | Wt 125.0 lb

## 2013-08-19 DIAGNOSIS — B309 Viral conjunctivitis, unspecified: Secondary | ICD-10-CM

## 2013-08-19 MED ORDER — OLOPATADINE HCL 0.2 % OP SOLN: 1.0000 [drp] | Freq: Every day | OPHTHALMIC | Status: DC

## 2013-08-19 NOTE — Patient Instructions (Signed)
You likely have viral conjunctivitis. This should resolve in another 3-5 days. If you develop pain in your eyes with discharge and any change in your vision please call as you will likely need antibiotics.  Please start the pataday drops for the redness in your eyes.   Viral Conjunctivitis Conjunctivitis is an irritation (inflammation) of the clear membrane that covers the white part of the eye (the conjunctiva). The irritation can also happen on the underside of the eyelids. Conjunctivitis makes the eye red or pink in color. This is what is commonly known as pink eye. Viral conjunctivitis can spread easily (contagious). CAUSES   Infection from virus on the surface of the eye.  Infection from the irritation or injury of nearby tissues such as the eyelids or cornea.  More serious inflammation or infection on the inside of the eye.  Other eye diseases.  The use of certain eye medications. SYMPTOMS  The normally white color of the eye or the underside of the eyelid is usually pink or red in color. The pink eye is usually associated with irritation, tearing and some sensitivity to light. Viral conjunctivitis is often associated with a clear, watery discharge. If a discharge is present, there may also be some blurred vision in the affected eye. DIAGNOSIS  Conjunctivitis is diagnosed by an eye exam. The eye specialist looks for changes in the surface tissues of the eye which take on changes characteristic of the specific types of conjunctivitis. A sample of any discharge may be collected on a Q-Tip (sterile swap). The sample will be sent to a lab to see whether or not the inflammation is caused by bacterial or viral infection. TREATMENT  Viral conjunctivitis will not respond to medicines that kill germs (antibiotics). Treatment is aimed at stopping a bacterial infection on top of the viral infection. The goal of treatment is to relieve symptoms (such as itching) with antihistamine drops or other eye  medications.  HOME CARE INSTRUCTIONS   To ease discomfort, apply a cool, clean wash cloth to your eye for 10 to 20 minutes, 3 to 4 times a day.  Gently wipe away any drainage from the eye with a warm, wet washcloth or a cotton ball.  Wash your hands often with soap and use paper towels to dry.  Do not share towels or washcloths. This may spread the infection.  Change or wash your pillowcase every day.  You should not use eye make-up until the infection is gone.  Stop using contacts lenses. Ask your eye professional how to sterilize or replace them before using again. This depends on the type of contact lenses used.  Do not touch the edge of the eyelid with the eye drop bottle or ointment tube when applying medications to the affected eye. This will stop you from spreading the infection to the other eye or to others. SEEK IMMEDIATE MEDICAL CARE IF:   The infection has not improved within 3 days of beginning treatment.  A watery discharge from the eye develops.  Pain in the eye increases.  The redness is spreading.  Vision becomes blurred.  An oral temperature above 102 F (38.9 C) develops, or as your caregiver suggests.  Facial pain, redness or swelling develops.  Any problems that may be related to the prescribed medicine develop. MAKE SURE YOU:   Understand these instructions.  Will watch your condition.  Will get help right away if you are not doing well or get worse. Document Released: 06/18/2005 Document Revised: 09/10/2011 Document  Reviewed: 02/05/2008 ExitCare Patient Information 2014 [place], [place].
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
## 2013-08-19 NOTE — Assessment & Plan Note (Signed)
Likely viral etiology.  Pataday Precautions given and all questions answered Pt aware of symptoms of bacterial infection and instructed to call any time for antibiotic drops if needed.

## 2013-08-26 ENCOUNTER — Encounter: Payer: BC Managed Care – PPO | Admitting: Family Medicine

## 2013-09-03 ENCOUNTER — Ambulatory Visit (INDEPENDENT_AMBULATORY_CARE_PROVIDER_SITE_OTHER): Payer: BC Managed Care – PPO | Admitting: Family Medicine

## 2013-09-03 ENCOUNTER — Other Ambulatory Visit (HOSPITAL_COMMUNITY)
Admission: RE | Admit: 2013-09-03 | Discharge: 2013-09-03 | Disposition: A | Discharge status: Home / Self Care | Payer: BC Managed Care – PPO | Source: Ambulatory Visit | Attending: Family Medicine | Admitting: Family Medicine

## 2013-09-03 VITALS — BP 123/67 | HR 82 | Temp 98.2°F | Ht 64.0 in | Wt 124.0 lb

## 2013-09-03 DIAGNOSIS — Z124 Encounter for screening for malignant neoplasm of cervix: Secondary | ICD-10-CM

## 2013-09-03 DIAGNOSIS — Z1151 Encounter for screening for human papillomavirus (HPV): Secondary | ICD-10-CM | POA: Insufficient documentation

## 2013-09-03 DIAGNOSIS — Z0000 Encounter for general adult medical examination without abnormal findings: Secondary | ICD-10-CM

## 2013-09-03 DIAGNOSIS — N760 Acute vaginitis: Secondary | ICD-10-CM

## 2013-09-03 DIAGNOSIS — Z01419 Encounter for gynecological examination (general) (routine) without abnormal findings: Secondary | ICD-10-CM | POA: Insufficient documentation

## 2013-09-03 DIAGNOSIS — Z113 Encounter for screening for infections with a predominantly sexual mode of transmission: Secondary | ICD-10-CM | POA: Insufficient documentation

## 2013-09-03 LAB — POCT WET PREP (WET MOUNT): Clue Cells Wet Prep Whiff POC: POSITIVE

## 2013-09-03 MED ORDER — NORGESTIM-ETH ESTRAD TRIPHASIC 0.18/0.215/0.25 MG-35 MCG PO TABS: 1.0000 | ORAL_TABLET | Freq: Every day | ORAL | Status: DC

## 2013-09-03 NOTE — Progress Notes (Signed)
Patient ID: Anna Bradley, female   DOB: 10-28-1975, 38 y.o.   MRN: [id number]  Annual Physical Exam  Patient has complaints today: Red eyes, nausea, headaches, dizziness, excessive thirst   [id number] Wt Readings from Last 3 Encounters:  09/03/13 124 lb (56.246 kg)  08/19/13 125 lb (56.7 kg)  03/08/13 122 lb (55.339 kg)   Last period: 08/29/2013 Regular periods: yes Heavy bleeding: no  Sexually active: yes, with one female for the last 6 years Birth control or hormonal therapy: Tubal ligation Hx of STD: Patient desires STD screening; history of chlamydia a long time ago. Dyspareunia: No Hot flashes: No Vaginal discharge: No Dysuria: No  Breast mass or concerns: No Last Pap: 2009  History of abnormal pap: Yes, biopsy was normal. Repeat pap normal.   FH of breast, uterine, ovarian, colon cancer: No  Physical Exam  Vitals reviewed. Constitutional: She is oriented to person, place, and time and well-developed, well-nourished, and in no distress.  HENT:  Right Ear: Tympanic membrane normal.  Left Ear: Tympanic membrane normal.  Nose: Nose normal.  Mouth/Throat: Oropharynx is clear and moist and mucous membranes are normal. Dental caries present.  Eyes: Conjunctivae, EOM and lids are normal. Pupils are equal, round, and reactive to light. No scleral icterus.  Neck: Trachea normal and normal range of motion. Neck supple.  Cardiovascular: Normal rate, regular rhythm, normal heart sounds and normal pulses.   No murmur heard. Pulmonary/Chest: Effort normal and breath sounds normal. Not tachypneic. No respiratory distress.  Abdominal: Soft. Normal appearance and bowel sounds are normal. There is no hepatosplenomegaly. There is no tenderness.  Genitourinary: Uterus normal, right adnexa normal, left adnexa normal and vulva normal. Thin  odorless  white and vaginal discharge found.  Cervix has ectropion present at 6 and 9 o'clock. Non-bleeding  Musculoskeletal: Normal range of  motion.  Lymphadenopathy:    She has no cervical adenopathy.  Neurological: She is alert and oriented to person, place, and time. She has normal strength. Gait normal.  Reflex Scores:      Tricep reflexes are 2+ on the right side and 2+ on the left side.      Bicep reflexes are 3+ on the right side and 3+ on the left side.      Brachioradialis reflexes are 2+ on the right side and 2+ on the left side.      Patellar reflexes are 2+ on the right side and 2+ on the left side. Skin: Skin is warm, dry and intact.  Psychiatric: Mood and affect normal.

## 2013-09-03 NOTE — Patient Instructions (Signed)
Anna Bradley, it was a pleasure seeing you today. Today we did your physical and pap smear. There was some evidence of cells from the inside of your cervix showing on the outside of your cervix. This could be normal but we will wait to see the results of the pap smear. Please see me in 2 weeks to discuss your other health concerns, or earlier if needed.   If you have any questions or concerns, please do not hesitate to call the office at [phone].  Sincerely,  Cordelia Poche, MD

## 2013-09-04 ENCOUNTER — Encounter: Payer: Self-pay | Admitting: Family Medicine

## 2013-09-04 DIAGNOSIS — Z0000 Encounter for general adult medical examination without abnormal findings: Secondary | ICD-10-CM | POA: Insufficient documentation

## 2013-09-04 LAB — CERVICOVAGINAL ANCILLARY ONLY
CHLAMYDIA, DNA PROBE: NEGATIVE
NEISSERIA GONORRHEA: NEGATIVE

## 2013-09-04 LAB — RPR

## 2013-09-04 LAB — HIV ANTIBODY (ROUTINE TESTING W REFLEX): HIV: NONREACTIVE

## 2013-09-04 NOTE — Assessment & Plan Note (Signed)
Patient had complaints of red eyes, ringing in ears, nausea, headaches, dizziness and excessive thirst. Patient not having symptoms of dizziness at time of encounter. Patient recently seen and evaluated for eyes. Symptoms have improved. Will address issues at a follow-up visit. Patient agreed with plan.

## 2013-09-14 ENCOUNTER — Telehealth: Payer: Self-pay | Admitting: *Deleted

## 2013-09-14 NOTE — Telephone Encounter (Signed)
Message copied by Johny Shears on Mon Sep 14, 2013  8:53 AM ------      Message from: Cordelia Poche A      Created: Sat Sep 12, 2013  8:05 AM       Please let patient know her STI testing was negative and her pap smear was normal. Thanks! ------

## 2013-09-14 NOTE — Telephone Encounter (Signed)
LVM for patient to call back. ?

## 2013-09-15 NOTE — Telephone Encounter (Signed)
Relayed message,patient voiced understanding. Anna Bradley S  

## 2014-05-03 ENCOUNTER — Encounter: Payer: Self-pay | Admitting: Family Medicine

## 2014-11-23 ENCOUNTER — Other Ambulatory Visit: Payer: Self-pay | Admitting: Family Medicine

## 2014-11-23 MED ORDER — NORGESTIM-ETH ESTRAD TRIPHASIC 0.18/0.215/0.25 MG-35 MCG PO TABS: 1.0000 | ORAL_TABLET | Freq: Every day | ORAL | Status: DC

## 2014-11-23 NOTE — Telephone Encounter (Signed)
Pt called and needs a refill on her Ortho-Tricyclen. jw

## 2014-11-23 NOTE — Telephone Encounter (Signed)
Letter mailed to patient. Veronnica Hennings,CMA  

## 2014-11-23 NOTE — Telephone Encounter (Signed)
Will refill for one month. Patient needs appointment for yearly physical and further refills

## 2015-01-10 ENCOUNTER — Telehealth: Payer: Self-pay | Admitting: Family Medicine

## 2015-01-10 NOTE — Telephone Encounter (Signed)
LMOVM for pt to return call .Fleeger, Jessica Dawn  

## 2015-01-10 NOTE — Telephone Encounter (Signed)
Will not refill medication as this was done in May 2016 with instructions to set up a follow-up appointment. Patient needs an appointment before refills will be sent in.

## 2015-01-10 NOTE — Telephone Encounter (Signed)
Pt called and would like a refill on her BC called in. jw

## 2015-01-11 NOTE — Telephone Encounter (Signed)
Pt doesn't have any insurance. It will not go into effect until Nov Could he give her [facility] until then?

## 2015-01-12 ENCOUNTER — Other Ambulatory Visit: Payer: Self-pay | Admitting: Family Medicine

## 2015-07-08 ENCOUNTER — Encounter: Payer: Self-pay | Admitting: Family Medicine

## 2015-07-08 ENCOUNTER — Ambulatory Visit (INDEPENDENT_AMBULATORY_CARE_PROVIDER_SITE_OTHER): Payer: Self-pay | Admitting: Family Medicine

## 2015-07-08 VITALS — BP 130/86 | HR 82 | Temp 98.6°F | Wt 125.4 lb

## 2015-07-08 DIAGNOSIS — D509 Iron deficiency anemia, unspecified: Secondary | ICD-10-CM

## 2015-07-08 DIAGNOSIS — N920 Excessive and frequent menstruation with regular cycle: Secondary | ICD-10-CM

## 2015-07-08 DIAGNOSIS — Z0000 Encounter for general adult medical examination without abnormal findings: Secondary | ICD-10-CM

## 2015-07-08 LAB — COMPLETE METABOLIC PANEL WITH GFR
ALT: 8 U/L (ref 6–29)
AST: 12 U/L (ref 10–30)
Albumin: 3.8 g/dL (ref 3.6–5.1)
Alkaline Phosphatase: 43 U/L (ref 33–115)
BILIRUBIN TOTAL: 0.4 mg/dL (ref 0.2–1.2)
BUN: 11 mg/dL (ref 7–25)
CO2: 25 mmol/L (ref 20–31)
CREATININE: 0.78 mg/dL (ref 0.50–1.10)
Calcium: 9.1 mg/dL (ref 8.6–10.2)
Chloride: 104 mmol/L (ref 98–110)
GFR, Est African American: >89 mL/min (ref >=60)
GFR, Est Non African American: >89 mL/min (ref >=60)
GLUCOSE: 91 mg/dL (ref 65–99)
Potassium: 4.5 mmol/L (ref 3.5–5.3)
SODIUM: 137 mmol/L (ref 135–146)
TOTAL PROTEIN: 7 g/dL (ref 6.1–8.1)

## 2015-07-08 LAB — LIPID PANEL
CHOL/HDL RATIO: 1.9 ratio (ref <=5.0)
Cholesterol: 164 mg/dL (ref 125–200)
HDL: 88 mg/dL (ref >=46)
LDL CALC: 56 mg/dL (ref <130)
TRIGLYCERIDES: 102 mg/dL (ref <150)
VLDL: 20 mg/dL (ref <30)

## 2015-07-08 LAB — CBC WITH DIFFERENTIAL/PLATELET
Basophils Absolute: 0.1 10*3/uL (ref 0.0–0.1)
Basophils Relative: 1 % (ref 0–1)
EOS ABS: 0.1 10*3/uL (ref 0.0–0.7)
EOS PCT: 2 % (ref 0–5)
HCT: 37.2 % (ref 36.0–46.0)
Hemoglobin: 11.7 g/dL — ABNORMAL LOW (ref 12.0–15.0)
LYMPHS ABS: 1.7 10*3/uL (ref 0.7–4.0)
Lymphocytes Relative: 33 % (ref 12–46)
MCH: 23.9 pg — AB (ref 26.0–34.0)
MCHC: 31.5 g/dL (ref 30.0–36.0)
MCV: 76.1 fL — AB (ref 78.0–100.0)
MONOS PCT: 5 % (ref 3–12)
MPV: 11.1 fL (ref 8.6–12.4)
Monocytes Absolute: 0.3 10*3/uL (ref 0.1–1.0)
NEUTROS PCT: 59 % (ref 43–77)
Neutro Abs: 3 10*3/uL (ref 1.7–7.7)
Platelets: 262 10*3/uL (ref 150–400)
RBC: 4.89 MIL/uL (ref 3.87–5.11)
RDW: 16.1 % — ABNORMAL HIGH (ref 11.5–15.5)
WBC: 5 10*3/uL (ref 4.0–10.5)

## 2015-07-08 MED ORDER — NORGESTIM-ETH ESTRAD TRIPHASIC 0.18/0.215/0.25 MG-35 MCG PO TABS: 1.0000 | ORAL_TABLET | Freq: Every day | ORAL | Status: DC

## 2015-07-08 NOTE — Progress Notes (Signed)
Subjective    Anna Bradley is a 40 y.o. female that presents for yearly physical exam.   Concerns: None  Goals    None      [id number] Wt Readings from Last 3 Encounters:  07/08/15 125 lb 6.4 oz (56.881 kg)  09/03/13 124 lb (56.246 kg)  08/19/13 125 lb (56.7 kg)   Last period: 06/22/2015 Regular periods: Yes Heavy bleeding: No  Sexually active: Yes Birth control or hormonal therapy: OCP, tubal ligation Dyspareunia: No Hot flashes: No Vaginal discharge:  Dysuria: No   Last mammogram: N/A Breast mass or concerns: No Last Pap: 09/03/2013 History of abnormal pap: Yes  FH of breast, uterine, ovarian, colon cancer: No  Past Medical History  Diagnosis Date  . ANEMIA, IRON DEFICIENCY, UNSPEC. 08/29/2006    Qualifier: Diagnosis of  By: Samara Snide    . ASCUS (atypical squamous cells of undetermined significance) on Pap smear 1999  . Ectopic pregnancy 2000  . Menorrhagia 2005    tx with ocps  . Trichomonas 2008  . PAPANICOLAOU SMEAR, ABNORMAL 08/29/2006    Qualifier: Diagnosis of By: Samara Snide      Past Surgical History  Procedure Laterality Date  . Btl  2003    No current outpatient prescriptions on file prior to visit.   No current facility-administered medications on file prior to visit.    No Known Allergies  Social History   Social History  . Marital Status: Single    Spouse Name: N/A  . Number of Children: N/A  . Years of Education: N/A   Social History Main Topics  . Smoking status: Never Smoker   . Smokeless tobacco: Never Used  . Alcohol Use: 0.6 oz/week    1 Glasses of wine per week  . Drug Use: No  . Sexual Activity:    Partners: Male    Birth Control/ Protection: Surgical   Other Topics Concern  . Not on file   Social History Narrative   Works as travel agent   3 boys   Lives with children    Family History  Problem Relation Age of Onset  . Hypertension Mother     ROS  Per HPI   Objective   BP 130/86 mmHg   Pulse 82  Temp(Src) 98.6 F (37 C) (Oral)  Wt 125 lb 6.4 oz (56.881 kg)  SpO2 99%  LMP 06/22/2015  General: well appearing, no distress HEENT:   Head:  Normocephalic  Eyes: Pupils equal and reactive to light/accomodation. Extraocular movements intact bilaterally.  Ears: Tympanic membranes normal bilaterally.  Nose/Throat: Nares patent bilaterally. Oropharnx clear and moist.  Neck: No cervical adenopathy bilaterally Respiratory/Chest: Clear to auscultation bilaterally. Unlabored work of breathing. No wheezing or rales. Cardiovascular: Regular rate and rhythm. Normal S1 and S2. No heart murmurs present. No extra heart sounds Gastrointestinal: Soft, non-tender, non-distended Genitourinary: Not exmained Musculoskeletal: Normal bulk Neuro: Normal gait, 2+ reflexes bilateral and equal Dermatologic: No obvious rashes Psychiatric: Full affect  Assessment and Plan    Meds ordered this encounter  Medications  . Norgestimate-Ethinyl Estradiol Triphasic (TRI-SPRINTEC) 0.18/0.215/0.25 MG-35 MCG tablet    Sig: Take 1 tablet by mouth daily.    Dispense:  1 Package    Refill:  11    Health Maintenance Due  Topic Date Due  . TETANUS/TDAP  07/06/2013  . INFLUENZA VACCINE  01/31/2015    Labs: CBC, CMP, Lipid panel  Healthcare maintenance: discussed mammography. Patient decided to wait until about 40 years  of age. No increase risk for breast cancer. She decline influenza vaccination.

## 2015-07-08 NOTE — Patient Instructions (Addendum)
Thank you for coming to see me today. It was a pleasure. Today we talked about:   Physical: You look very healthy. Your exam was completely normal. I am getting lab work. Please expect results int he mail. We discussed breast cancer screening and you decided to hold off until about 40 years old.  Please come back in one year for a physical.  If you have any questions or concerns, please do not hesitate to call the office at [phone].  Sincerely,  Cordelia Poche, MD

## 2015-07-12 ENCOUNTER — Encounter: Payer: Self-pay | Admitting: Family Medicine

## 2016-04-09 ENCOUNTER — Telehealth: Payer: Self-pay | Admitting: Family Medicine

## 2016-04-09 NOTE — Telephone Encounter (Signed)
insurance form dropped off for at front desk for completion.  Verified that patient section of form has been completed.  Last DOS/WCC with PCP was 07/08/15  Placed form in white team folder to be completed by clinical staff.  Carmina Miller

## 2016-04-10 NOTE — Telephone Encounter (Signed)
Clinical info completed on insurance form.  Place form in Dr. Nida Boatman box for completion.  Katharina Caper, April D, [place]

## 2016-04-12 NOTE — Telephone Encounter (Signed)
Patient informed that form is complete and ready for pick up.  Martin, Tamika L, RN  

## 2016-06-19 ENCOUNTER — Other Ambulatory Visit: Payer: Self-pay | Admitting: Family Medicine

## 2016-06-19 DIAGNOSIS — N920 Excessive and frequent menstruation with regular cycle: Secondary | ICD-10-CM

## 2016-06-19 MED ORDER — NORGESTIM-ETH ESTRAD TRIPHASIC 0.18/0.215/0.25 MG-35 MCG PO TABS: 1.0000 | ORAL_TABLET | Freq: Every day | ORAL | 11 refills | Status: DC

## 2016-06-19 NOTE — Telephone Encounter (Signed)
Pt needs a refill on birth control. Pt uses [facility] on [address]. Please advise. Thanks! ep

## 2017-06-28 ENCOUNTER — Other Ambulatory Visit: Payer: Self-pay | Admitting: Family Medicine

## 2017-06-28 DIAGNOSIS — N920 Excessive and frequent menstruation with regular cycle: Secondary | ICD-10-CM

## 2017-07-12 ENCOUNTER — Other Ambulatory Visit: Payer: Self-pay | Admitting: Family Medicine

## 2017-07-12 DIAGNOSIS — N920 Excessive and frequent menstruation with regular cycle: Secondary | ICD-10-CM

## 2017-07-12 NOTE — Telephone Encounter (Signed)
Pt called the pharmacy and they said they do not have her refill for her [facility]. It said it was received on 1/2 but the pharmacy did not get it

## 2017-09-17 ENCOUNTER — Encounter (HOSPITAL_COMMUNITY): Payer: Self-pay | Admitting: Emergency Medicine

## 2017-09-17 ENCOUNTER — Ambulatory Visit (HOSPITAL_COMMUNITY)
Admission: EM | Admit: 2017-09-17 | Discharge: 2017-09-17 | Disposition: A | Discharge status: Home / Self Care | Payer: BLUE CROSS/BLUE SHIELD | Attending: Family Medicine | Admitting: Family Medicine

## 2017-09-17 DIAGNOSIS — J069 Acute upper respiratory infection, unspecified: Secondary | ICD-10-CM | POA: Diagnosis not present

## 2017-09-17 DIAGNOSIS — B9789 Other viral agents as the cause of diseases classified elsewhere: Secondary | ICD-10-CM | POA: Diagnosis not present

## 2017-09-17 MED ORDER — IPRATROPIUM BROMIDE 0.03 % NA SOLN
2.0000 | Freq: Two times a day (BID) | NASAL | 0 refills | Status: DC
Start: 2017-09-17 — End: 2018-08-15

## 2017-09-17 MED ORDER — PREDNISONE 20 MG PO TABS: ORAL_TABLET | ORAL | 0 refills | Status: DC

## 2017-09-17 NOTE — ED Provider Notes (Signed)
[place]   [id number] 09/17/17 Arrival Time: 1011   SUBJECTIVE:  Anna Bradley is a 42 y.o. female who presents to the urgent care with complaint of head cold, chest congestion x 4 days. Voice is raspy.  Cough is bothersome.  Works as Radio broadcast assistant.  Past Medical History:  Diagnosis Date  . ANEMIA, IRON DEFICIENCY, UNSPEC. 08/29/2006   Qualifier: Diagnosis of  By: Samara Snide    . ASCUS (atypical squamous cells of undetermined significance) on Pap smear 1999  . Ectopic pregnancy 2000  . Menorrhagia 2005   tx with ocps  . PAPANICOLAOU SMEAR, ABNORMAL 08/29/2006   Qualifier: Diagnosis of By: Samara Snide    . Trichomonas 2008   Family History  Problem Relation Age of Onset  . Hypertension Mother    Social History   Socioeconomic History  . Marital status: Single    Spouse name: Not on file  . Number of children: Not on file  . Years of education: Not on file  . Highest education level: Not on file  Social Needs  . Financial resource strain: Not on file  . Food insecurity - worry: Not on file  . Food insecurity - inability: Not on file  . Transportation needs - medical: Not on file  . Transportation needs - non-medical: Not on file  Occupational History  . Not on file  Tobacco Use  . Smoking status: Never Smoker  . Smokeless tobacco: Never Used  Substance and Sexual Activity  . Alcohol use: Yes    Alcohol/week: 0.6 oz    Types: 1 Glasses of wine per week  . Drug use: No  . Sexual activity: Yes    Partners: Male    Birth control/protection: Surgical  Other Topics Concern  . 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   No Known Allergies    ROS: As per HPI, remainder of ROS negative.   OBJECTIVE:   Vitals:   09/17/17 1057  BP: (!) 154/96  Pulse: (!) 109  Resp: 18  Temp:  98.7 F (37.1 C)  SpO2: 100%     General appearance: alert; no distress Eyes: PERRL; EOMI; conjunctiva normal HENT: normocephalic; atraumatic; TMs normal, canal normal, external ears normal without trauma; nasal mucosa normal; oral mucosa normal Neck: supple Lungs: clear to auscultation bilaterally Heart: regular rate and rhythm Abdomen: soft, non-tender; bowel sounds normal; no masses or organomegaly; no guarding or rebound tenderness Back: no CVA tenderness Extremities: no cyanosis or edema; symmetrical with no gross deformities Skin: warm and dry Neurologic: normal gait; grossly normal Psychological: alert and cooperative; normal mood and affect      Labs:  Results for orders placed or performed in visit on 07/08/15  COMPLETE METABOLIC PANEL WITH GFR  Result Value Ref Range   Sodium 137 135 - 146 mmol/L   Potassium 4.5 3.5 - 5.3 mmol/L   Chloride 104 98 - 110 mmol/L   CO2 25 20 - 31 mmol/L   Glucose, Bld 91 65 - 99 mg/dL   BUN 11 7 - 25 mg/dL   Creat 0.78 0.50 - 1.10 mg/dL   Total Bilirubin 0.4 0.2 - 1.2 mg/dL   Alkaline Phosphatase 43 33 - 115 U/L   AST 12 10 - 30 U/L   ALT 8 6 - 29 U/L  Total Protein 7.0 6.1 - 8.1 g/dL   Albumin 3.8 3.6 - 5.1 g/dL   Calcium 9.1 8.6 - 10.2 mg/dL   GFR, Est African American >89 >=60 mL/min   GFR, Est Non African American >89 >=60 mL/min  CBC with Differential/Platelet  Result Value Ref Range   WBC 5.0 4.0 - 10.5 K/uL   RBC 4.89 3.87 - 5.11 MIL/uL   Hemoglobin 11.7 (L) 12.0 - 15.0 g/dL   HCT 37.2 36.0 - 46.0 %   MCV 76.1 (L) 78.0 - 100.0 fL   MCH 23.9 (L) 26.0 - 34.0 pg   MCHC 31.5 30.0 - 36.0 g/dL   RDW 16.1 (H) 11.5 - 15.5 %   Platelets 262 150 - 400 K/uL   MPV 11.1 8.6 - 12.4 fL   Neutrophils Relative % 59 43 - 77 %   Neutro Abs 3.0 1.7 - 7.7 K/uL   Lymphocytes Relative 33 12 - 46 %   Lymphs Abs 1.7 0.7 - 4.0 K/uL   Monocytes Relative 5 3 - 12 %   Monocytes Absolute 0.3 0.1 - 1.0 K/uL   Eosinophils Relative 2 0 - 5 %    Eosinophils Absolute 0.1 0.0 - 0.7 K/uL   Basophils Relative 1 0 - 1 %   Basophils Absolute 0.1 0.0 - 0.1 K/uL   Smear Review Criteria for review not met   Lipid panel  Result Value Ref Range   Cholesterol 164 125 - 200 mg/dL   Triglycerides 102 <150 mg/dL   HDL 88 >=46 mg/dL   Total CHOL/HDL Ratio 1.9 <=5.0 Ratio   VLDL 20 <30 mg/dL   LDL Cholesterol 56 <130 mg/dL    Labs Reviewed - No data to display  No results found.     ASSESSMENT & PLAN:  1. Viral URI with cough     Meds ordered this encounter  Medications  . ipratropium (ATROVENT) 0.03 % nasal spray    Sig: Place 2 sprays into both nostrils 2 (two) times daily.    Dispense:  30 mL    Refill:  0  . predniSONE (DELTASONE) 20 MG tablet    Sig: Two daily with food    Dispense:  10 tablet    Refill:  0    Reviewed expectations re: course of current medical issues. Questions answered. Outlined signs and symptoms indicating need for more acute intervention. Patient verbalized understanding. After Visit Summary given.      Robyn Haber, MD 09/17/17 1124

## 2017-09-17 NOTE — ED Triage Notes (Signed)
Pt c/o head cold, chest congestion x2 days.

## 2017-10-16 ENCOUNTER — Ambulatory Visit (INDEPENDENT_AMBULATORY_CARE_PROVIDER_SITE_OTHER): Payer: BLUE CROSS/BLUE SHIELD | Admitting: Family Medicine

## 2017-10-16 ENCOUNTER — Other Ambulatory Visit: Payer: Self-pay

## 2017-10-16 VITALS — BP 128/76 | HR 81 | Temp 98.1°F | Ht 64.0 in | Wt 134.6 lb

## 2017-10-16 DIAGNOSIS — D500 Iron deficiency anemia secondary to blood loss (chronic): Secondary | ICD-10-CM

## 2017-10-16 DIAGNOSIS — Z0000 Encounter for general adult medical examination without abnormal findings: Secondary | ICD-10-CM

## 2017-10-16 DIAGNOSIS — N920 Excessive and frequent menstruation with regular cycle: Secondary | ICD-10-CM

## 2017-10-16 NOTE — Assessment & Plan Note (Signed)
History of this secondary to menorrhagia, will check CBC and ferritin today at patient request.  Has not been on iron for many years.  Asked her to sign up for my chart to follow-up with these results.

## 2017-10-16 NOTE — Progress Notes (Signed)
   CC: physical  HPI  No concerns today. States she has always been at a healthy weight, last year tried to gain weight but couldn't. Never smoker, occasional alcohol use. No drug use. States mood is not a problem for her, no hx of meds.   Menorrhagia - Takes sprintec every day. Taking OCPs for heavy periods. Had BTL for birth contorl. Feels OCPs are helping with periods, but also feels these have changed over the last year or so. Having headaches and back back pain with periods. No prior hx of headaches, other than occasionally with stress. Headaches with menses or a few days after. Blurry vision, no stars, sparkles, lines in her vision. + Phonophobia, photophobia no N/V. Takes several ibu or APAP for each headache. 3 days in a row.   Iron deficiency anemia: Last took iron years ago.  States this was due to heavy menses.   History of abnormal Pap: Patient reports that her Pap was ASCUS in 1999, reports she had colposcopy at this time but denies LEEP or other procedure.  ROS: Denies CP, SOB, abdominal pain, dysuria, changes in BMs.   CC, SH/smoking status, and VS noted  Objective: BP 128/76   Pulse 81   Temp 98.1 F (36.7 C) (Oral)   Ht 5\' 4"  (1.626 m)   Wt 134 lb 9.6 oz (61.1 kg)   LMP 10/08/2017 (Exact Date)   SpO2 99%   BMI 23.10 kg/m  Gen: NAD, alert, cooperative, and pleasant. HEENT: NCAT, EOMI, PERRL CV: RRR, no murmur Resp: CTAB, no wheezes, non-labored Abd: SNTND, BS present, no guarding or organomegaly Ext: No edema, warm Neuro: Alert and oriented, Speech clear, No gross deficits  Assessment and plan:  MENORRHAGIA History of heavy periods, now controlled on OCPs.  Given headaches concerning for migraine with possible aura, will DC OCPs after this pack.  Counseled patient that if heavy periods recur, we would be happy to place a progesterone IUD, which have indication for this purpose.  Reviewed insertion and risks and benefits of Mirena.  Patient will call if she has  concerns with heavy periods after stopping the OCPs.  ANEMIA, IRON DEFICIENCY, UNSPEC. History of this secondary to menorrhagia, will check CBC and ferritin today at patient request.  Has not been on iron for many years.  Asked her to sign up for my chart to follow-up with these results.   Orders Placed This Encounter  Procedures  . CBC  . Ferritin    No orders of the defined types were placed in this encounter.  Health Maintenance reviewed - patient candidate for Pap smear every 5 years given normal Paps for many in a row, HPV negative on last Pap in 2015.Marland Kitchen  Ralene Ok, MD, PGY2 10/16/2017 9:03 AM

## 2017-10-16 NOTE — Assessment & Plan Note (Signed)
History of heavy periods, now controlled on OCPs.  Given headaches concerning for migraine with possible aura, will DC OCPs after this pack.  Counseled patient that if heavy periods recur, we would be happy to place a progesterone IUD, which have indication for this purpose.  Reviewed insertion and risks and benefits of Mirena.  Patient will call if she has concerns with heavy periods after stopping the OCPs.

## 2017-10-16 NOTE — Patient Instructions (Signed)
It was a pleasure to see you today! Thank you for choosing [facility] for your primary care. Anna Bradley was seen for physical, heavy periods.   Our plans for today were:  Stop your birth control pills after this pack.   Call me if you have any concerns with heavy periods and we can put the Mirena IUD in place.   Best,  Dr. Lindell Noe

## 2017-10-17 LAB — CBC
HEMOGLOBIN: 10.8 g/dL — AB (ref 11.1–15.9)
Hematocrit: 35.1 % (ref 34.0–46.6)
MCH: 23.4 pg — ABNORMAL LOW (ref 26.6–33.0)
MCHC: 30.8 g/dL — AB (ref 31.5–35.7)
MCV: 76 fL — ABNORMAL LOW (ref 79–97)
Platelets: 258 10*3/uL (ref 150–379)
RBC: 4.61 x10E6/uL (ref 3.77–5.28)
RDW: 16.7 % — ABNORMAL HIGH (ref 12.3–15.4)
WBC: 4.7 10*3/uL (ref 3.4–10.8)

## 2017-10-17 LAB — FERRITIN: FERRITIN: 19 ng/mL (ref 15–150)

## 2017-10-18 ENCOUNTER — Encounter: Payer: Self-pay | Admitting: Family Medicine

## 2017-10-25 MED ORDER — IRON (FERROUS SULFATE) 142 (45 FE) MG PO TBCR
1.0000 | EXTENDED_RELEASE_TABLET | ORAL | 3 refills | Status: DC
Start: 2017-10-25 — End: 2019-02-25

## 2017-10-25 NOTE — Addendum Note (Signed)
Addended by: Glenis Smoker on: 10/25/2017 08:39 AM   Modules accepted: Orders

## 2018-02-28 ENCOUNTER — Telehealth: Payer: Self-pay | Admitting: Family Medicine

## 2018-02-28 NOTE — Telephone Encounter (Signed)
insurance form dropped off for at front desk for completion.  Verified that patient section of form has been completed.  Last DOS/WCC with PCP was 10/16/17.  Placed form in  [facility] team folder to be completed by clinical staff.  Anna Bradley

## 2018-03-04 NOTE — Telephone Encounter (Signed)
Clinical info completed on Insurance form.  Place form in Dr. Rosario Jacks box for completion. Pt will need to have their waist measured and documented once she comes to pick it up.   Katharina Caper, April D, [place]

## 2018-03-05 NOTE — Telephone Encounter (Signed)
Pt aware she needs waist measurement- will come to nurse clinic Friday. Forms in nurse office in "[place]. Forms" folder Wallace Cullens, RN

## 2018-03-05 NOTE — Telephone Encounter (Signed)
Pt will need waist circumference measured when she comes to pick up form- left message for patient to return my call to let her know. Forms in RN office in "Misc. Forms" folder. Wallace Cullens, RN

## 2018-03-05 NOTE — Telephone Encounter (Signed)
Placed form in RN box.

## 2018-03-07 ENCOUNTER — Ambulatory Visit: Payer: BLUE CROSS/BLUE SHIELD

## 2018-03-07 DIAGNOSIS — Z0289 Encounter for other administrative examinations: Secondary | ICD-10-CM

## 2018-03-07 NOTE — Telephone Encounter (Signed)
Pt came by office, waist measured and form completed. Copy in scan box Wallace Cullens, RN

## 2018-03-07 NOTE — Progress Notes (Signed)
Pt here for waist measurement to complete insurance forms.Waist measures 32". Forms completed and given to pt. Wallace Cullens, RN

## 2018-04-01 ENCOUNTER — Telehealth: Payer: Self-pay

## 2018-04-01 NOTE — Telephone Encounter (Signed)
To white pool. 

## 2018-04-01 NOTE — Telephone Encounter (Signed)
Pt called nurse line stating her insurance denied her bc of her blood pressure. Per chart review, her BP was 128/76 at her April physical. Pt stated she needs a letter from her pcp stating her BP is WNL and she is not risk for high blood pressure. Pt will pick this up when ready. Please advise.

## 2018-04-01 NOTE — Telephone Encounter (Signed)
Contacted pt and gave her the below information.  Scheduled a nurse visit for her on 04/03/18 @2 :30pm for this.  Routing to PCP so that she can have this letter ready for pt when she comes for that visit if everything is ok. Katharina Caper, April D, [place]

## 2018-04-01 NOTE — Telephone Encounter (Signed)
technically I think her insurance is using the cutoff of "elevated" BP being >120/80. She could come in for a RN clinic visit for BP recheck and I could have the letter ready for her to pick up that day if her BP is improved. Could you help her schedule this?

## 2018-04-02 NOTE — Telephone Encounter (Signed)
Created letter for patient in epic. Please enter BP if normal into the letter and print to give to her. I will not in be clinic tomorrow to do this.

## 2018-04-03 ENCOUNTER — Ambulatory Visit: Payer: BLUE CROSS/BLUE SHIELD | Admitting: *Deleted

## 2018-04-03 DIAGNOSIS — Z0000 Encounter for general adult medical examination without abnormal findings: Secondary | ICD-10-CM

## 2018-04-03 NOTE — Progress Notes (Signed)
Pt is here for BP check (see previous phone note).   BP today is 120/78.  Letter provided to patient. Denina Rieger, Salome Spotted, CMA

## 2018-08-12 ENCOUNTER — Other Ambulatory Visit: Payer: Self-pay

## 2018-08-12 DIAGNOSIS — N920 Excessive and frequent menstruation with regular cycle: Secondary | ICD-10-CM

## 2018-08-14 NOTE — Telephone Encounter (Signed)
Contacted pt and she did want a refill, scheduled an appointment to discuss this on 08/15/18 with PCP.  Will route as an FYI. Katharina Caper, Iliani Vejar D, [place]

## 2018-08-14 NOTE — Telephone Encounter (Signed)
Please call patient and clarify - she was going to stop her birth control pills after the last pack in April 2019 as she was having migraines with aura, which means oral birth control pills are not safe for her any more. Does she truly want a refill or was this a pharmacy error? If she wants a refill, we need to get her an appt to discuss this further before we would restart any birth control.

## 2018-08-15 ENCOUNTER — Other Ambulatory Visit: Payer: Self-pay

## 2018-08-15 ENCOUNTER — Ambulatory Visit (INDEPENDENT_AMBULATORY_CARE_PROVIDER_SITE_OTHER): Payer: Commercial Managed Care - PPO | Admitting: Family Medicine

## 2018-08-15 ENCOUNTER — Encounter: Payer: Self-pay | Admitting: Family Medicine

## 2018-08-15 VITALS — BP 140/70 | HR 110 | Temp 98.2°F | Wt 132.4 lb

## 2018-08-15 DIAGNOSIS — Z3009 Encounter for other general counseling and advice on contraception: Secondary | ICD-10-CM

## 2018-08-15 DIAGNOSIS — D500 Iron deficiency anemia secondary to blood loss (chronic): Secondary | ICD-10-CM | POA: Diagnosis not present

## 2018-08-15 DIAGNOSIS — N920 Excessive and frequent menstruation with regular cycle: Secondary | ICD-10-CM

## 2018-08-15 LAB — POCT URINE PREGNANCY: Preg Test, Ur: NEGATIVE

## 2018-08-15 NOTE — Patient Instructions (Addendum)
It was a pleasure to see you today! Thank you for choosing [facility] for your primary care. Anna Bradley was seen for headaches, iron, periods.   Our plans for today were:  For your iron, please restart this. Taking it every other day can help it be better absorbed, but if that is too hard to remember, just take it daily. If you get constipated, you can take miralax or colace.   For your headaches, keep using tylenol and or ibuprofen as needed. Don't stop caffeine around your period, because this could make it worse. Having your blood count number higher, could help with your headaches.   For your heavy periods, we placed an OB referral to discuss another pill option vs IUD vs ablation.   Best,  Dr. Lindell Noe

## 2018-08-15 NOTE — Progress Notes (Signed)
   CC: f/u OCP question  HPI  not taking atrovent or iron at all. Completed prednisone. Updated med list.   Didn't really take the iron before. Kind of slipped her mind.   Heavy periods - Ran out of OCPs this month, she is on the last week of her pack. Sometimes her period is light, sometimes heavier. Heavy for her means 4 hours of having to change her pad 2-3 times. This is over the first two days. She also has cramping, this is more over the last few months. Depo seemed to worsen her HA in the past, she had AUB worse on this.   Headaches - almost all of the days of her period. Using advil, which helps some. Occasional caffience. She avoid this during her period.   ROS: Denies CP, SOB, abdominal pain, dysuria, changes in BMs.   CC, SH/smoking status, and VS noted  Objective: BP 140/70   Pulse (!) 110   Temp 98.2 F (36.8 C) (Oral)   Wt 132 lb 6 oz (60 kg)   SpO2 99%   BMI 22.72 kg/m  Gen: NAD, alert, cooperative, and pleasant. HEENT: NCAT, EOMI, PERRL CV: RRR, no murmur - not tachy on my exam Resp: CTAB, no wheezes, non-labored Abd: SNTND, BS present, no guarding or organomegaly Ext: No edema, warm Neuro: Alert and oriented, Speech clear, No gross deficits  Assessment and plan:  ANEMIA, IRON DEFICIENCY, UNSPEC. Recheck CBC and ferritin today, patient has not been taking iron.  Counseled her that anemia may worsen headaches and so it would be a good idea to restart the iron.  Also counseled on as needed MiraLAX or Colace if constipation.  MENORRHAGIA Given that patient endorses migraines with aura, she is no longer a candidate for estrogen-containing birth control.  We discussed at length the options of IUD placed in our clinic versus referral to [facility] for ablation.  I also briefly explained that there may be another oral medicine she could try that would be prescribed by the OB is called Megace.  She elected for an OB referral so that she could further discuss all these options  including ablation.  She is status post BTL so does not need birth control.  I offered Depo to her, however she felt this worsened her menorrhagia and headaches in the past.   Orders Placed This Encounter  Procedures  . CBC  . Ferritin  . Ambulatory referral to Obstetrics / Gynecology    Referral Priority:   Routine    Referral Type:   Consultation    Referral Reason:   Specialty Services Required    Requested Specialty:   Obstetrics and Gynecology    Number of Visits Requested:   1  . POCT urine pregnancy    No orders of the defined types were placed in this encounter.   Ralene Ok, MD, PGY3 08/15/2018 9:48 AM

## 2018-08-15 NOTE — Assessment & Plan Note (Signed)
Given that patient endorses migraines with aura, she is no longer a candidate for estrogen-containing birth control.  We discussed at length the options of IUD placed in our clinic versus referral to [facility] for ablation.  I also briefly explained that there may be another oral medicine she could try that would be prescribed by the OB is called Megace.  She elected for an OB referral so that she could further discuss all these options including ablation.  She is status post BTL so does not need birth control.  I offered Depo to her, however she felt this worsened her menorrhagia and headaches in the past.

## 2018-08-15 NOTE — Assessment & Plan Note (Signed)
Recheck CBC and ferritin today, patient has not been taking iron.  Counseled her that anemia may worsen headaches and so it would be a good idea to restart the iron.  Also counseled on as needed MiraLAX or Colace if constipation.

## 2018-08-16 LAB — CBC
HEMATOCRIT: 36.4 % (ref 34.0–46.6)
HEMOGLOBIN: 11.4 g/dL (ref 11.1–15.9)
MCH: 23.9 pg — AB (ref 26.6–33.0)
MCHC: 31.3 g/dL — ABNORMAL LOW (ref 31.5–35.7)
MCV: 77 fL — AB (ref 79–97)
Platelets: 254 10*3/uL (ref 150–450)
RBC: 4.76 x10E6/uL (ref 3.77–5.28)
RDW: 15.9 % — ABNORMAL HIGH (ref 11.7–15.4)
WBC: 4.2 10*3/uL (ref 3.4–10.8)

## 2018-08-16 LAB — FERRITIN: Ferritin: 14 ng/mL — ABNORMAL LOW (ref 15–150)

## 2018-08-18 ENCOUNTER — Other Ambulatory Visit: Payer: Self-pay

## 2018-08-18 ENCOUNTER — Encounter: Payer: Self-pay | Admitting: Family Medicine

## 2018-08-18 DIAGNOSIS — N920 Excessive and frequent menstruation with regular cycle: Secondary | ICD-10-CM

## 2018-09-28 ENCOUNTER — Encounter: Payer: Self-pay | Admitting: Family Medicine

## 2018-10-02 ENCOUNTER — Ambulatory Visit: Payer: Commercial Managed Care - PPO | Admitting: Family Medicine

## 2018-10-09 ENCOUNTER — Encounter: Payer: Commercial Managed Care - PPO | Admitting: Obstetrics & Gynecology

## 2018-12-22 ENCOUNTER — Encounter: Payer: Self-pay | Admitting: Family Medicine

## 2019-01-08 ENCOUNTER — Telehealth: Payer: Self-pay | Admitting: Obstetrics and Gynecology

## 2019-01-08 NOTE — Telephone Encounter (Signed)
Spoke to patient about her appointment on 7/10 @ 11:15. Patient instructed that the appointment is a mychart visit. Patient verbalized she has the app downloaded and knows how to access the appointment.

## 2019-01-09 ENCOUNTER — Telehealth (INDEPENDENT_AMBULATORY_CARE_PROVIDER_SITE_OTHER): Payer: Commercial Managed Care - PPO | Admitting: Obstetrics and Gynecology

## 2019-01-09 ENCOUNTER — Encounter: Payer: Self-pay | Admitting: Obstetrics and Gynecology

## 2019-01-09 ENCOUNTER — Telehealth: Payer: Self-pay

## 2019-01-09 ENCOUNTER — Other Ambulatory Visit: Payer: Self-pay

## 2019-01-09 DIAGNOSIS — N946 Dysmenorrhea, unspecified: Secondary | ICD-10-CM | POA: Diagnosis not present

## 2019-01-09 DIAGNOSIS — N920 Excessive and frequent menstruation with regular cycle: Secondary | ICD-10-CM

## 2019-01-09 NOTE — Progress Notes (Signed)
TELEHEALTH VIRTUAL GYNECOLOGY VISIT ENCOUNTER NOTE  I connected with Anna Bradley on 01/10/19 at 11:15 AM EDT by telephone at home and verified that I am speaking with the correct person using two identifiers.   I discussed the limitations, risks, security and privacy concerns of performing an evaluation and management service by telephone and the availability of in person appointments. I also discussed with the patient that there may be a patient responsible charge related to this service. The patient expressed understanding and agreed to proceed.  Obstetrics and Gynecology New Patient Consult  Appointment Date: 01/09/2019  OBGYN Clinic: Center for [facility]  Primary Care Provider: Cleophas Dunker  Referring Provider: Esmond Camper, MD and Sela Hilding, MD  Chief Complaint: dysmenorrhea, menorrhagia  History of Present Illness: Anna Bradley is a 43 y.o. African-American [phone] (Patient's last menstrual period was 12/23/2018 (exact date).), seen for the above chief complaint. Her past medical history is significant for anemia, STDS, abnormal paps, BTL  Patient referred by PCP for above CC. Patient tried OCPs in the past but with no real help. Pt also tried depo but mae her HAs worse.   No breast s/s, fevers, chills, chest pain, SOB, nausea, vomiting, abdominal pain, dysuria, hematuria, vaginal itching, dyspareunia, diarrhea, constipation, blood in BMs  Review of Systems: as noted in the History of Present Illness.  Patient Active Problem List   Diagnosis Date Noted  . ANEMIA, IRON DEFICIENCY, UNSPEC. 08/29/2006  . MENORRHAGIA 08/29/2006    Past Medical History:  Past Medical History:  Diagnosis Date  . ANEMIA, IRON DEFICIENCY, UNSPEC. 08/29/2006   Qualifier: Diagnosis of  By: Samara Snide    . ASCUS (atypical squamous cells of undetermined significance) on Pap smear 1999  . Ectopic pregnancy 2000  . Menorrhagia 2005   tx with ocps   . PAPANICOLAOU SMEAR, ABNORMAL 08/29/2006   Qualifier: Diagnosis of By: Samara Snide    . Trichomonas 2008    Past Surgical History:  Past Surgical History:  Procedure Laterality Date  . TUBAL LIGATION  2003  . UNILATERAL SALPINGECTOMY Right    ex-lap, RS for ectopic  . UNILATERAL SALPINGECTOMY Left    postpartum    Past Obstetrical History:  OB History  Gravida Para Term Preterm AB Living  4 3 3   1 3   SAB TAB Ectopic Multiple Live Births      1        # Outcome Date GA Lbr Len/2nd Weight Sex Delivery Anes PTL Lv  4 Ectopic           3 Term           2 Term           1 Term             Obstetric Comments  SVD x 3.     Past Gynecological History: As per HPI. Periods: Qmonth, regular, 5 days, painful, heavy History of Pap Smear(s): Yes.   Last pap 2015, which was neg and hpv neg  Social History:  Social History   Socioeconomic History  . Marital status: Single    Spouse name: Not on file  . Number of children: Not on file  . Years of education: Not on file  . Highest education level: Not on file  Occupational History  . Not on file  Social Needs  . Financial resource strain: Not on file  . Food insecurity    Worry: Not on file  Inability: Not on file  . Transportation needs    Medical: Not on file    Non-medical: Not on file  Tobacco Use  . Smoking status: Never Smoker  . Smokeless tobacco: Never Used  Substance and Sexual Activity  . Alcohol use: Yes    Alcohol/week: 1.0 standard drinks    Types: 1 Glasses of wine per week  . Drug use: No  . Sexual activity: Yes    Partners: Male    Birth control/protection: Surgical  Lifestyle  . Physical activity    Days per week: Not on file    Minutes per session: Not on file  . Stress: Not on file  Relationships  . Social Herbalist on phone: Not on file    Gets together: Not on file    Attends religious service: Not on file    Active member of club or organization: Not on file    Attends  meetings of clubs or organizations: Not on file    Relationship status: Not on file  . Intimate partner violence    Fear of current or ex partner: Not on file    Emotionally abused: Not on file    Physically abused: Not on file    Forced sexual activity: Not on file  Other Topics Concern  . Not on file  Social History Narrative   Works as travel agent   3 boys   Lives with children    Family History:  Family History  Problem Relation Age of Onset  . Hypertension Mother    She denies any female cancers  Health Maintenance:  Mammogram(s): No.   Medications Anna Mcnew. Bradley had no medications administered during this visit. Current Outpatient Medications  Medication Sig Dispense Refill  . Iron, Ferrous Sulfate, 142 (45 Fe) MG TBCR Take 1 tablet by mouth every other day. 30 tablet 3   No current facility-administered medications for this visit.     Allergies Patient has no known allergies.   Physical Exam:  LMP 12/23/2018 (Exact Date)  There is no height or weight on file to calculate BMI. General:  Alert, oriented and cooperative.   Mental Status: Normal mood and affect perceived. Normal judgment and thought content.  Physical exam deferred due to nature of the encounter  Laboratory:  CBC Latest Ref Rng & Units 08/15/2018 10/16/2017 07/08/2015  WBC 3.4 - 10.8 x10E3/uL 4.2 4.7 5.0  Hemoglobin 11.1 - 15.9 g/dL 11.4 10.8(L) 11.7(L)  Hematocrit 34.0 - 46.6 % 36.4 35.1 37.2  Platelets 150 - 450 x10E3/uL 254 258 262    Radiology: none  Assessment: pt stable  Plan:  1. Dysmenorrhea D/w her that recommend having a early cycle pelvic u/s and then follow up for in person exam to do full PE and update her pap smear. Can also set up for mammogram per patient desire.  - US PELVIC COMPLETE WITH TRANSVAGINAL; Future  2. MENORRHAGIA See above  RTC after pelvic u/s  I discussed the assessment and treatment plan with the patient. The patient was provided an opportunity to  ask questions and all were answered. The patient agreed with the plan and demonstrated an understanding of the instructions.   The patient was advised to call back or seek an in-person evaluation/go to the ED if the symptoms worsen or if the condition fails to improve as anticipated.  I provided 20 minutes of non-face-to-face time during this encounter. The visit was done via a Phone visit.   Eduard Clos  Cassell Smiles MD Attending Center for [facility])

## 2019-01-09 NOTE — Telephone Encounter (Signed)
Called pt with [place] appointment on 01/29/2019 , arrive at 7:45 with full bladder to [address]. Pt verbalized understanding.

## 2019-01-16 ENCOUNTER — Telehealth (INDEPENDENT_AMBULATORY_CARE_PROVIDER_SITE_OTHER): Payer: Commercial Managed Care - PPO

## 2019-01-16 DIAGNOSIS — N920 Excessive and frequent menstruation with regular cycle: Secondary | ICD-10-CM

## 2019-01-16 NOTE — Telephone Encounter (Addendum)
Pt called and stated that she had an appt on July 30th and she started her period a week early.  Called pt and pt asked if she can still go to her appt on July 30th which is an [place] since she started her period a week early. I explained to the pt that she should be off her period by that time so that is okay.  Pt verbalized understanding.

## 2019-01-29 ENCOUNTER — Ambulatory Visit (HOSPITAL_COMMUNITY)
Admission: RE | Admit: 2019-01-29 | Discharge: 2019-01-29 | Disposition: A | Discharge status: Home / Self Care | Payer: Commercial Managed Care - PPO | Source: Ambulatory Visit | Attending: Obstetrics and Gynecology | Admitting: Obstetrics and Gynecology

## 2019-01-29 ENCOUNTER — Other Ambulatory Visit: Payer: Self-pay

## 2019-01-29 DIAGNOSIS — N920 Excessive and frequent menstruation with regular cycle: Secondary | ICD-10-CM | POA: Diagnosis present

## 2019-01-29 DIAGNOSIS — N946 Dysmenorrhea, unspecified: Secondary | ICD-10-CM | POA: Diagnosis not present

## 2019-02-16 ENCOUNTER — Other Ambulatory Visit: Payer: Self-pay

## 2019-02-16 ENCOUNTER — Encounter (HOSPITAL_COMMUNITY): Payer: Self-pay

## 2019-02-16 ENCOUNTER — Emergency Department (HOSPITAL_COMMUNITY)
Admission: EM | Admit: 2019-02-16 | Discharge: 2019-02-16 | Disposition: A | Discharge status: Home / Self Care | Payer: Commercial Managed Care - PPO | Attending: Emergency Medicine | Admitting: Emergency Medicine

## 2019-02-16 DIAGNOSIS — W268XXA Contact with other sharp object(s), not elsewhere classified, initial encounter: Secondary | ICD-10-CM | POA: Diagnosis not present

## 2019-02-16 DIAGNOSIS — Y939 Activity, unspecified: Secondary | ICD-10-CM | POA: Insufficient documentation

## 2019-02-16 DIAGNOSIS — S61217A Laceration without foreign body of left little finger without damage to nail, initial encounter: Secondary | ICD-10-CM | POA: Diagnosis not present

## 2019-02-16 DIAGNOSIS — Y999 Unspecified external cause status: Secondary | ICD-10-CM | POA: Insufficient documentation

## 2019-02-16 DIAGNOSIS — Y929 Unspecified place or not applicable: Secondary | ICD-10-CM | POA: Insufficient documentation

## 2019-02-16 NOTE — ED Notes (Signed)
Patient verbalizes understanding of discharge instructions. Opportunity for questioning and answers were provided. Armband removed by staff, pt discharged from ED.  

## 2019-02-16 NOTE — ED Provider Notes (Signed)
[place] EMERGENCY DEPARTMENT Provider Note   CSN: [id number] Arrival date & time: 02/16/19  1923     History   Chief Complaint Chief Complaint  Patient presents with  . Laceration    HPI Anna Bradley is a 43 y.o. female who presents with a left pinky finger laceration. She states that last night she cut her finger on a can. She cleaned it and wrapped it up. Today she was at work and it was slightly swollen and the "meat was coming out" therefore she decided to come to the ED to be checked out. She is UTD on tetanus.      HPI  Past Medical History:  Diagnosis Date  . ANEMIA, IRON DEFICIENCY, UNSPEC. 08/29/2006   Qualifier: Diagnosis of  By: Samara Snide    . ASCUS (atypical squamous cells of undetermined significance) on Pap smear 1999  . Ectopic pregnancy 2000  . Menorrhagia 2005   tx with ocps  . PAPANICOLAOU SMEAR, ABNORMAL 08/29/2006   Qualifier: Diagnosis of By: Samara Snide    . Trichomonas 2008    Patient Active Problem List   Diagnosis Date Noted  . ANEMIA, IRON DEFICIENCY, UNSPEC. 08/29/2006  . MENORRHAGIA 08/29/2006    Past Surgical History:  Procedure Laterality Date  . TUBAL LIGATION  2003  . UNILATERAL SALPINGECTOMY Right    ex-lap, RS for ectopic  . UNILATERAL SALPINGECTOMY Left    postpartum     OB History    Gravida  4   Para  3   Term  3   Preterm      AB  1   Living  3     SAB      TAB      Ectopic  1   Multiple      Live Births           Obstetric Comments  SVD x 3.          Home Medications    Prior to Admission medications   Medication Sig Start Date End Date Taking? Authorizing Provider  Iron, Ferrous Sulfate, 142 (45 Fe) MG TBCR Take 1 tablet by mouth every other day. 10/25/17   Glenis Smoker, MD    Family History Family History  Problem Relation Age of Onset  . Hypertension Mother     Social History Social History   Tobacco Use  . Smoking status: Never Smoker  .  Smokeless tobacco: Never Used  Substance Use Topics  . Alcohol use: Yes    Alcohol/week: 1.0 standard drinks    Types: 1 Glasses of wine per week  . Drug use: No     Allergies   Patient has no known allergies.   Review of Systems Review of Systems  Skin: Positive for wound.  Neurological: Negative for weakness.     Physical Exam Updated Vital Signs BP (!) 148/94 (BP Location: Right Arm)   Pulse 93   Temp 98.6 F (37 C) (Oral)   Resp 18   SpO2 100%   Physical Exam Vitals signs and nursing note reviewed.  Constitutional:      General: She is not in acute distress.    Appearance: Normal appearance. She is well-developed. She is not ill-appearing.  HENT:     Head: Normocephalic and atraumatic.  Eyes:     General: No scleral icterus.       Right eye: No discharge.        Left eye: No discharge.  Conjunctiva/sclera: Conjunctivae normal.     Pupils: Pupils are equal, round, and reactive to light.  Neck:     Musculoskeletal: Normal range of motion.  Cardiovascular:     Rate and Rhythm: Normal rate.  Pulmonary:     Effort: Pulmonary effort is normal. No respiratory distress.  Abdominal:     General: There is no distension.  Musculoskeletal:     Comments: ~2cm superficial laceration over the distal pinky finger tip. Mild swelling over the finger tip. Normal ROM of the PIP and DIP joint.   Skin:    General: Skin is warm and dry.  Neurological:     Mental Status: She is alert and oriented to person, place, and time.  Psychiatric:        Behavior: Behavior normal.      ED Treatments / Results  Labs (all labs ordered are listed, but only abnormal results are displayed) Labs Reviewed - No data to display  EKG None  Radiology No results found.  Procedures Procedures (including critical care time)  Medications Ordered in ED Medications - No data to display   Initial Impression / Assessment and Plan / ED Course  I have reviewed the triage vital signs  and the nursing notes.  Pertinent labs & imaging results that were available during my care of the patient were reviewed by me and considered in my medical decision making (see chart for details).  43 year old female presents with a ~24 hour laceration of the pinky finger tip. She is already UTD on tetanus. She has been providing adequate wound care. There is mild swelling over the finger tip but it does not appear acutely infected and it seems it would be too early for this to be apparent. I discussed with her that I could not close the wound due to late presentation and to continue local wound care. She was encouraged to return if wound is worsening  Final Clinical Impressions(s) / ED Diagnoses   Final diagnoses:  Laceration of left little finger without foreign body without damage to nail, initial encounter    ED Discharge Orders    None       Recardo Evangelist, PA-C 02/16/19 2158    Margette Fast, MD 02/17/19 [phone]

## 2019-02-16 NOTE — Discharge Instructions (Signed)
Keep area clean by washing with soap and water daily. Do not submerge in water  Apply a bandage at least once daily, change more often if it is dirty Watch for signs of infection (redness, drainage, worsening pain) Take Tylenol or Ibuprofen for pain as needed

## 2019-02-16 NOTE — ED Triage Notes (Signed)
Pt states that she cut her R pinky on a can yesterday, tetanus up to date

## 2019-02-16 NOTE — ED Notes (Signed)
Cm long lac noted to anterior side of pinky. Bleeding controlled with bandage applied.

## 2019-02-24 ENCOUNTER — Telehealth: Payer: Self-pay | Admitting: Family Medicine

## 2019-02-24 NOTE — Telephone Encounter (Signed)
Patient called in stating she is returning a call from the office. Patient was instructed it was to confirm her appointment on 8/26 @ 3:15. Patient instructed to wear a face mask for the entire appointment and no visitors are allowed during the visit. Patient screened for covid symptoms and denied having any.

## 2019-02-25 ENCOUNTER — Other Ambulatory Visit: Payer: Self-pay

## 2019-02-25 ENCOUNTER — Ambulatory Visit (INDEPENDENT_AMBULATORY_CARE_PROVIDER_SITE_OTHER): Payer: Commercial Managed Care - PPO | Admitting: Obstetrics and Gynecology

## 2019-02-25 ENCOUNTER — Encounter: Payer: Self-pay | Admitting: Obstetrics and Gynecology

## 2019-02-25 VITALS — BP 134/88 | HR 92 | Ht 65.0 in | Wt 138.0 lb

## 2019-02-25 DIAGNOSIS — Z124 Encounter for screening for malignant neoplasm of cervix: Secondary | ICD-10-CM

## 2019-02-25 DIAGNOSIS — Z01419 Encounter for gynecological examination (general) (routine) without abnormal findings: Secondary | ICD-10-CM

## 2019-02-25 DIAGNOSIS — N920 Excessive and frequent menstruation with regular cycle: Secondary | ICD-10-CM

## 2019-02-25 DIAGNOSIS — N946 Dysmenorrhea, unspecified: Secondary | ICD-10-CM

## 2019-02-25 DIAGNOSIS — Z113 Encounter for screening for infections with a predominantly sexual mode of transmission: Secondary | ICD-10-CM | POA: Diagnosis not present

## 2019-02-25 DIAGNOSIS — Z1231 Encounter for screening mammogram for malignant neoplasm of breast: Secondary | ICD-10-CM

## 2019-02-25 DIAGNOSIS — Z1151 Encounter for screening for human papillomavirus (HPV): Secondary | ICD-10-CM

## 2019-02-26 DIAGNOSIS — N946 Dysmenorrhea, unspecified: Secondary | ICD-10-CM | POA: Insufficient documentation

## 2019-02-26 HISTORY — DX: Dysmenorrhea, unspecified: N94.6

## 2019-02-26 NOTE — Progress Notes (Signed)
Obstetrics and Gynecology Established Patient Evaluation  Appointment Date: 02/25/2019  OBGYN Clinic: [facility] for [facility]  Primary Care Provider: Glenis Smoker  Referring Provider: No ref. provider found  Chief Complaint: Ultrasound follow up  History of Present Illness: Anna Bradley is a 43 y.o. African-American [phone] (Patient's last menstrual period was 02/09/2019 (within days).), seen for the above chief complaint. Her past medical history is significant for anemia, STD, abnormal paps, h/o ectopic pregnancy surgery, BTL.    Patient seen by me on 7/10 video visit for patient referral visit for dysmenorrhea, menorrhagia. Pt set up for ultrasound and follow up.  No current bleeding, pain.   Review of Systems:  as noted in the History of Present Illness.   Past Medical History:  Past Medical History:  Diagnosis Date  . ANEMIA, IRON DEFICIENCY, UNSPEC. 08/29/2006   Qualifier: Diagnosis of  By: Samara Snide    . ASCUS (atypical squamous cells of undetermined significance) on Pap smear 1999  . Ectopic pregnancy 2000  . Menorrhagia 2005   tx with ocps  . PAPANICOLAOU SMEAR, ABNORMAL 08/29/2006   Qualifier: Diagnosis of By: Samara Snide    . Trichomonas 2008    Past Surgical History:  Past Surgical History:  Procedure Laterality Date  . TUBAL LIGATION  2003  . UNILATERAL SALPINGECTOMY Right    ex-lap, RS for ectopic  . UNILATERAL SALPINGECTOMY Left    postpartum    Past Obstetrical History:  OB History  Gravida Para Term Preterm AB Living  4 3 3   1 3   SAB TAB Ectopic Multiple Live Births      1        # Outcome Date GA Lbr Len/2nd Weight Sex Delivery Anes PTL Lv  4 Ectopic           3 Term           2 Term           1 Term             Obstetric Comments  SVD x 3.     Past Gynecological History: As per HPI. Periods: Qmonth, regular, 5 days, painful, heavy History of Pap Smear(s): Yes.   Last pap 2015, which was neg and hpv  neg Patient has tried OCPs in the past but no help and had depo provera but made her HAs worse.   Social History:  Social History   Socioeconomic History  . Marital status: Single    Spouse name: Not on file  . Number of children: Not on file  . Years of education: Not on file  . Highest education level: Not on file  Occupational History  . Not on file  Social Needs  . Financial resource strain: Not on file  . Food insecurity    Worry: Not on file    Inability: Not on file  . Transportation needs    Medical: Not on file    Non-medical: Not on file  Tobacco Use  . Smoking status: Never Smoker  . Smokeless tobacco: Never Used  Substance and Sexual Activity  . Alcohol use: Yes    Alcohol/week: 1.0 standard drinks    Types: 1 Glasses of wine per week  . Drug use: No  . Sexual activity: Yes    Partners: Male    Birth control/protection: Surgical  Lifestyle  . Physical activity    Days per week: Not on file    Minutes per session: Not on file  .  Stress: Not on file  Relationships  . Social Herbalist on phone: Not on file    Gets together: Not on file    Attends religious service: Not on file    Active member of club or organization: Not on file    Attends meetings of clubs or organizations: Not on file    Relationship status: Not on file  . Intimate partner violence    Fear of current or ex partner: Not on file    Emotionally abused: Not on file    Physically abused: Not on file    Forced sexual activity: Not on file  Other Topics Concern  . Not on file  Social History Narrative   Works as travel agent   3 boys   Lives with children    Family History:  Family History  Problem Relation Age of Onset  . Hypertension Mother    She denies any female cancers   Medications [employer]. Hacker had no medications administered during this visit. No current outpatient medications on file.   No current facility-administered medications for this visit.      Allergies Patient has no known allergies.   Physical Exam:  BP 134/88   Pulse 92   Ht 5\' 5"  (1.651 m)   Wt 138 lb (62.6 kg)   LMP 02/09/2019 (Within Days)   BMI 22.96 kg/m  Body mass index is 22.96 kg/m. General appearance: Well nourished, well developed female in no acute distress.  Neck:  Supple, normal appearance, and no thyromegaly  Cardiovascular: normal s1 and s2.  No murmurs, rubs or gallops. Respiratory:  Clear to auscultation bilateral. Normal respiratory effort Abdomen: positive bowel sounds and no masses, hernias; diffusely non tender to palpation, non distended Neuro/Psych:  Normal mood and affect.  Skin:  Warm and dry.  Lymphatic:  No inguinal lymphadenopathy.   Pelvic exam: is not limited by body habitus EGBUS: within normal limits, Vagina: within normal limits and with no blood or discharge in the vault, narrow introitus, Cervix: normal appearing cervix without tenderness, discharge or lesions. Uterus:  Nonenlarged, 8wk sized uterus, mobile and non tender and Adnexa:  normal adnexa and no mass, fullness, tenderness Rectovaginal: deferred  Laboratory: no new labs  Radiology:  CLINICAL DATA:  Patient with dysmenorrhea.  Menorrhagia.  EXAM: TRANSABDOMINAL AND TRANSVAGINAL ULTRASOUND OF PELVIS  TECHNIQUE: Both transabdominal and transvaginal ultrasound examinations of the pelvis were performed. Transabdominal technique was performed for global imaging of the pelvis including uterus, ovaries, adnexal regions, and pelvic cul-de-sac. It was necessary to proceed with endovaginal exam following the transabdominal exam to visualize the endometrium.  COMPARISON:  None  FINDINGS: Uterus  Measurements: 9.7 x 5.3 x 6.2 cm = volume: 168 mL. The uterus is retroverted. Heterogeneity of the myometrium with posterior acoustic shadowing.  Endometrium  Thickness: 5.5 mm.  No focal abnormality visualized.  Right ovary  Measurements: 3.7 x 2.3 x 2.4 cm  = volume: 10.7 mL. Normal appearance/no adnexal mass. Corpus luteum.  Left ovary  Measurements: 4.3 x 1.0 x 2.3 cm = volume: 5.1 mL. Normal appearance/no adnexal mass.  Other findings  Small right adnexal free fluid.  IMPRESSION: Heterogeneity of the myometrium raising the possibility of adenomyosis.  Endometrium measures 5 mm. If bleeding remains unresponsive to hormonal or medical therapy, sonohysterogram should be considered for focal lesion work-up. (Ref: Radiological Reasoning: Algorithmic Workup of Abnormal Vaginal Bleeding with Endovaginal Sonography and Sonohysterography. [id number] [id number])   Electronically Signed   By:  Lovey Newcomer M.D.   On: 01/29/2019 13:14  Assessment: pt stable  Plan:  1. Well woman exam with routine gynecological exam Will set up for mammogram. Pap today - Cytology - PAP( [place])  2. Dysmenorrhea, Menorrhagia D/w her re: adenomyosis. I told her I'd recommend either a Mirena or surgical options. I told her she could try an ablation but given risk of adeno it may not work. I told her I'd recommend either Mirena or hysterectomy, which I would try laparoscopically given her exam above. Pt to consider options. Will follow up with her after her pap is back.   RTC PRN  Durene Romans MD Attending Center for [facility])

## 2019-02-27 LAB — CYTOLOGY - PAP
Chlamydia: NEGATIVE
Diagnosis: NEGATIVE
HPV: NOT DETECTED
Neisseria Gonorrhea: NEGATIVE
Trichomonas: NEGATIVE

## 2019-03-13 ENCOUNTER — Telehealth: Payer: Self-pay | Admitting: Obstetrics and Gynecology

## 2019-03-13 NOTE — Telephone Encounter (Signed)
GYN Telephone Note  Patient called at [phone] and options again d/w her and she'd like to proceed with hysterectomy. Message sent to scheduling for [facility]  Durene Romans MD Attending Center for [place] (Faculty Practice) 03/13/2019 Time: [phone]

## 2019-04-09 ENCOUNTER — Other Ambulatory Visit: Payer: Self-pay

## 2019-04-09 ENCOUNTER — Ambulatory Visit
Admission: RE | Admit: 2019-04-09 | Discharge: 2019-04-09 | Disposition: A | Discharge status: Home / Self Care | Payer: Commercial Managed Care - PPO | Source: Ambulatory Visit | Attending: Obstetrics and Gynecology | Admitting: Obstetrics and Gynecology

## 2019-04-09 DIAGNOSIS — Z1231 Encounter for screening mammogram for malignant neoplasm of breast: Secondary | ICD-10-CM

## 2019-04-13 ENCOUNTER — Other Ambulatory Visit: Payer: Self-pay | Admitting: Obstetrics and Gynecology

## 2019-04-13 DIAGNOSIS — R928 Other abnormal and inconclusive findings on diagnostic imaging of breast: Secondary | ICD-10-CM

## 2019-04-15 ENCOUNTER — Ambulatory Visit
Admission: RE | Admit: 2019-04-15 | Discharge: 2019-04-15 | Disposition: A | Discharge status: Home / Self Care | Payer: Commercial Managed Care - PPO | Source: Ambulatory Visit | Attending: Obstetrics and Gynecology | Admitting: Obstetrics and Gynecology

## 2019-04-15 ENCOUNTER — Ambulatory Visit: Payer: Commercial Managed Care - PPO

## 2019-04-15 ENCOUNTER — Other Ambulatory Visit: Payer: Self-pay

## 2019-04-15 DIAGNOSIS — R928 Other abnormal and inconclusive findings on diagnostic imaging of breast: Secondary | ICD-10-CM

## 2019-06-15 ENCOUNTER — Telehealth: Payer: Self-pay

## 2019-06-15 NOTE — Telephone Encounter (Signed)
Received request from [employer], that she needs a prior auth for the procedure that pt has scheduled with Dr. Ilda Basset on 06/30/19.  Message routed to [place].

## 2019-06-17 ENCOUNTER — Telehealth: Payer: Self-pay | Admitting: General Practice

## 2019-06-17 NOTE — Telephone Encounter (Signed)
This has already been taken care of

## 2019-06-17 NOTE — Telephone Encounter (Signed)
Telephone message left on nurse voicemail line from [place] with [facility] stating the patient needs a prior auth completed prior to her procedure. Call back number left [phone]

## 2019-06-19 ENCOUNTER — Other Ambulatory Visit: Payer: Self-pay | Admitting: Obstetrics and Gynecology

## 2019-06-22 DIAGNOSIS — Z029 Encounter for administrative examinations, unspecified: Secondary | ICD-10-CM

## 2019-06-23 ENCOUNTER — Ambulatory Visit: Admit: 2019-06-23 | Payer: Commercial Managed Care - PPO | Admitting: Obstetrics and Gynecology

## 2019-06-23 SURGERY — HYSTERECTOMY, TOTAL, LAPAROSCOPIC, WITH SALPINGECTOMY
Anesthesia: Choice | Laterality: Bilateral

## 2019-06-23 NOTE — Pre-Procedure Instructions (Signed)
Jezebel Flamenco  06/23/2019      [facility] ([place]), [place] - [address] [place] ([place]) [place] [address] Phone: [phone] Fax: [phone]    Your procedure is scheduled on June 30, 2019.  Report to [facility] Entrance "A" at 530 AM.  Call this number if you have problems the morning of surgery:  [phone]  Call [phone] if you have any questions prior to your surgery date Monday-Friday 8am-4pm    Remember:  Do not eat  after midnight.  You may drink clear liquids until 430 AM.  Clear liquids allowed are:                    Water, Juice (non-citric and without pulp), Carbonated beverages, Clear Tea, Black Coffee only and Gatorade  Please complete your PRE-SURGERY ENSURE that was provided to you by 430 AM  the morning of surgery.  Please, if able, drink it in one setting. DO NOT SIP.    Take these medicines the morning of surgery with A SIP OF WATER NONE  7 days prior to surgery STOP taking any Aspirin (unless otherwise instructed by your surgeon), Aleve, Naproxen, Ibuprofen, Motrin, Advil, Goody's, BC's, all herbal medications, fish oil, and all vitamins   Day of surgery:  Do not wear jewelry, make-up or nail polish.  Do not wear lotions, powders, or perfumes, or deodorant.  Do not shave 48 hours prior to surgery.   Do not bring valuables to the hospital.  [facility] is not responsible for any belongings or valuables.  IF you are a smoker, DO NOT Smoke 24 hours prior to surgery   IF you wear a CPAP at night please bring your mask, tubing, and machine the morning of surgery    Remember that you must have someone to transport you home after your surgery, and remain with you for 24 hours if you are discharged the same day.  Contacts, dentures or bridgework may not be worn into surgery.  Leave your suitcase in the car.  After surgery it may be brought to your room.  For patients admitted to the  hospital, discharge time will be determined by your treatment team.  Patients discharged the day of surgery will not be allowed to drive home.    [place]- Preparing For Surgery  Before surgery, you can play an important role. Because skin is not sterile, your skin needs to be as free of germs as possible. You can reduce the number of germs on your skin by washing with CHG (chlorahexidine gluconate) Soap before surgery.  CHG is an antiseptic cleaner which kills germs and bonds with the skin to continue killing germs even after washing.    Oral Hygiene is also important to reduce your risk of infection.  Remember - BRUSH YOUR TEETH THE MORNING OF SURGERY WITH YOUR REGULAR TOOTHPASTE  Please do not use if you have an allergy to CHG or antibacterial soaps. If your skin becomes reddened/irritated stop using the CHG.  Do not shave (including legs and underarms) for at least 48 hours prior to first CHG shower. It is OK to shave your face.  Please follow these instructions carefully.   1. Shower the NIGHT BEFORE SURGERY and the MORNING OF SURGERY with CHG.   2. If you chose to wash your hair, wash your hair first as usual with your normal shampoo.  3. After you shampoo, rinse your hair and body  thoroughly to remove the shampoo.  4. Use CHG as you would any other liquid soap. You can apply CHG directly to the skin and wash gently with a scrungie or a clean washcloth.   5. Apply the CHG Soap to your body ONLY FROM THE NECK DOWN.  Do not use on open wounds or open sores. Avoid contact with your eyes, ears, mouth and genitals (private parts). Wash Face and genitals (private parts)  with your normal soap.  6. Wash thoroughly, paying special attention to the area where your surgery will be performed.  7. Thoroughly rinse your body with warm water from the neck down.  8. DO NOT shower/wash with your normal soap after using and rinsing off the CHG Soap.  9. Pat yourself dry with a CLEAN  TOWEL.  10. Wear CLEAN PAJAMAS to bed the night before surgery, wear comfortable clothes the morning of surgery  11. Place CLEAN SHEETS on your bed the night of your first shower and DO NOT SLEEP WITH PETS.  Day of Surgery: Shower as above Do not apply any deodorants/lotions.  Please wear clean clothes to the hospital/surgery center.   Remember to brush your teeth WITH YOUR REGULAR TOOTHPASTE.  Please read over the following fact sheets that you were given.

## 2019-06-24 ENCOUNTER — Encounter (HOSPITAL_COMMUNITY)
Admission: RE | Admit: 2019-06-24 | Discharge: 2019-06-24 | Disposition: A | Discharge status: Home / Self Care | Payer: Commercial Managed Care - PPO | Source: Ambulatory Visit | Attending: Obstetrics and Gynecology | Admitting: Obstetrics and Gynecology

## 2019-06-24 ENCOUNTER — Encounter (HOSPITAL_COMMUNITY): Payer: Self-pay

## 2019-06-24 ENCOUNTER — Other Ambulatory Visit: Payer: Self-pay

## 2019-06-24 DIAGNOSIS — Z01812 Encounter for preprocedural laboratory examination: Secondary | ICD-10-CM | POA: Insufficient documentation

## 2019-06-24 DIAGNOSIS — N920 Excessive and frequent menstruation with regular cycle: Secondary | ICD-10-CM | POA: Diagnosis not present

## 2019-06-24 DIAGNOSIS — N946 Dysmenorrhea, unspecified: Secondary | ICD-10-CM | POA: Insufficient documentation

## 2019-06-24 LAB — CBC
HCT: 36.3 % (ref 36.0–46.0)
Hemoglobin: 11.3 g/dL — ABNORMAL LOW (ref 12.0–15.0)
MCH: 24 pg — ABNORMAL LOW (ref 26.0–34.0)
MCHC: 31.1 g/dL (ref 30.0–36.0)
MCV: 77.1 fL — ABNORMAL LOW (ref 80.0–100.0)
Platelets: 292 10*3/uL (ref 150–400)
RBC: 4.71 MIL/uL (ref 3.87–5.11)
RDW: 16.9 % — ABNORMAL HIGH (ref 11.5–15.5)
WBC: 4.5 10*3/uL (ref 4.0–10.5)
nRBC: 0 % (ref 0.0–0.2)

## 2019-06-24 LAB — COMPREHENSIVE METABOLIC PANEL
ALT: 14 U/L (ref 0–44)
AST: 19 U/L (ref 15–41)
Albumin: 3.8 g/dL (ref 3.5–5.0)
Alkaline Phosphatase: 54 U/L (ref 38–126)
Anion gap: 8 (ref 5–15)
BUN: 13 mg/dL (ref 6–20)
CO2: 25 mmol/L (ref 22–32)
Calcium: 9 mg/dL (ref 8.9–10.3)
Chloride: 105 mmol/L (ref 98–111)
Creatinine, Ser: 0.98 mg/dL (ref 0.44–1.00)
GFR calc Af Amer: >60 mL/min (ref >60)
GFR calc non Af Amer: >60 mL/min (ref >60)
Glucose, Bld: 97 mg/dL (ref 70–99)
Potassium: 4.2 mmol/L (ref 3.5–5.1)
Sodium: 138 mmol/L (ref 135–145)
Total Bilirubin: 0.7 mg/dL (ref 0.3–1.2)
Total Protein: 7.1 g/dL (ref 6.5–8.1)

## 2019-06-24 LAB — ABO/RH: ABO/RH(D): A POS

## 2019-06-24 LAB — TYPE AND SCREEN
ABO/RH(D): A POS
Antibody Screen: NEGATIVE

## 2019-06-24 NOTE — Progress Notes (Signed)
PCP - Wille Celeste @ [facility] Cardiologist - na   Chest x-ray - na EKG - na Stress Test - na ECHO - na Cardiac Cath - na  Sleep Study - na CPAP -   Fasting Blood Sugar -  na Checks Blood Sugar _____ times a day  Blood Thinner Instructions:na Aspirin Instructions:  ERAS Protcol -yes PRE-SURGERY Ensure  given  COVID TEST- 06/27/19   Anesthesia review:   Patient denies shortness of breath, fever, cough and chest pain at PAT appointment   All instructions explained to the patient, with a verbal understanding of the material. Patient agrees to go over the instructions while at home for a better understanding. Patient also instructed to self quarantine after being tested for COVID-19. The opportunity to ask questions was provided.

## 2019-06-27 ENCOUNTER — Other Ambulatory Visit (HOSPITAL_COMMUNITY)
Admission: RE | Admit: 2019-06-27 | Discharge: 2019-06-27 | Disposition: A | Discharge status: Home / Self Care | Payer: Commercial Managed Care - PPO | Source: Ambulatory Visit | Attending: Obstetrics and Gynecology | Admitting: Obstetrics and Gynecology

## 2019-06-27 DIAGNOSIS — Z01812 Encounter for preprocedural laboratory examination: Secondary | ICD-10-CM | POA: Diagnosis present

## 2019-06-27 DIAGNOSIS — Z20828 Contact with and (suspected) exposure to other viral communicable diseases: Secondary | ICD-10-CM | POA: Diagnosis not present

## 2019-06-27 LAB — SARS CORONAVIRUS 2 (TAT 6-24 HRS): SARS Coronavirus 2: NEGATIVE

## 2019-06-30 ENCOUNTER — Observation Stay (HOSPITAL_COMMUNITY): Payer: Commercial Managed Care - PPO | Admitting: Physician Assistant

## 2019-06-30 ENCOUNTER — Encounter (HOSPITAL_COMMUNITY)
Admission: RE | Disposition: A | Discharge status: Home / Self Care | Payer: Self-pay | Source: Home / Self Care | Attending: Obstetrics and Gynecology

## 2019-06-30 ENCOUNTER — Encounter (HOSPITAL_COMMUNITY): Payer: Self-pay | Admitting: Obstetrics and Gynecology

## 2019-06-30 ENCOUNTER — Observation Stay (HOSPITAL_COMMUNITY)
Admission: RE | Admit: 2019-06-30 | Discharge: 2019-07-01 | Disposition: A | Discharge status: Home / Self Care | Payer: Commercial Managed Care - PPO | Attending: Obstetrics and Gynecology | Admitting: Obstetrics and Gynecology

## 2019-06-30 ENCOUNTER — Other Ambulatory Visit: Payer: Self-pay

## 2019-06-30 DIAGNOSIS — N946 Dysmenorrhea, unspecified: Principal | ICD-10-CM | POA: Insufficient documentation

## 2019-06-30 DIAGNOSIS — Z90710 Acquired absence of both cervix and uterus: Secondary | ICD-10-CM | POA: Diagnosis present

## 2019-06-30 DIAGNOSIS — N920 Excessive and frequent menstruation with regular cycle: Secondary | ICD-10-CM | POA: Diagnosis not present

## 2019-06-30 DIAGNOSIS — D259 Leiomyoma of uterus, unspecified: Secondary | ICD-10-CM | POA: Insufficient documentation

## 2019-06-30 HISTORY — PX: TOTAL LAPAROSCOPIC HYSTERECTOMY WITH SALPINGECTOMY: SHX6742

## 2019-06-30 HISTORY — PX: CYSTOSCOPY: SHX5120

## 2019-06-30 LAB — POCT PREGNANCY, URINE: Preg Test, Ur: NEGATIVE

## 2019-06-30 SURGERY — HYSTERECTOMY, TOTAL, LAPAROSCOPIC, WITH SALPINGECTOMY
Anesthesia: General | Site: Urethra

## 2019-06-30 MED ORDER — SUGAMMADEX SODIUM 200 MG/2ML IV SOLN
INTRAVENOUS | Status: DC | PRN
  Administered 2019-06-30: 150 mg via INTRAVENOUS

## 2019-06-30 MED ORDER — STERILE WATER FOR IRRIGATION IR SOLN
Status: DC | PRN
  Administered 2019-06-30: 1000 mL

## 2019-06-30 MED ORDER — LACTATED RINGERS IV SOLN: INTRAVENOUS | Status: DC

## 2019-06-30 MED ORDER — DEXAMETHASONE SODIUM PHOSPHATE 10 MG/ML IJ SOLN
INTRAMUSCULAR | Status: AC
  Filled 2019-06-30: qty 1

## 2019-06-30 MED ORDER — PROPOFOL 10 MG/ML IV BOLUS
INTRAVENOUS | Status: AC
  Filled 2019-06-30: qty 20

## 2019-06-30 MED ORDER — FENTANYL CITRATE (PF) 100 MCG/2ML IJ SOLN
INTRAMUSCULAR | Status: AC
  Filled 2019-06-30: qty 2

## 2019-06-30 MED ORDER — ACETAMINOPHEN 325 MG PO TABS
650.0000 mg | ORAL_TABLET | ORAL | Status: DC
  Administered 2019-06-30 – 2019-07-01 (×5): 650 mg via ORAL
  Filled 2019-06-30 (×5): qty 2

## 2019-06-30 MED ORDER — FENTANYL CITRATE (PF) 250 MCG/5ML IJ SOLN
INTRAMUSCULAR | Status: AC
  Filled 2019-06-30: qty 5

## 2019-06-30 MED ORDER — MIDAZOLAM HCL 2 MG/2ML IJ SOLN
INTRAMUSCULAR | Status: AC
  Filled 2019-06-30: qty 2

## 2019-06-30 MED ORDER — CEFAZOLIN SODIUM 1 G IJ SOLR
INTRAMUSCULAR | Status: AC
  Filled 2019-06-30: qty 20

## 2019-06-30 MED ORDER — FENTANYL CITRATE (PF) 250 MCG/5ML IJ SOLN
INTRAMUSCULAR | Status: DC | PRN
  Administered 2019-06-30: 100 ug via INTRAVENOUS
  Administered 2019-06-30 (×3): 50 ug via INTRAVENOUS

## 2019-06-30 MED ORDER — OXYCODONE HCL 5 MG PO TABS
5.0000 mg | ORAL_TABLET | Freq: Four times a day (QID) | ORAL | Status: DC | PRN
  Administered 2019-06-30: 10 mg via ORAL
  Filled 2019-06-30: qty 2

## 2019-06-30 MED ORDER — PHENYLEPHRINE 40 MCG/ML (10ML) SYRINGE FOR IV PUSH (FOR BLOOD PRESSURE SUPPORT)
PREFILLED_SYRINGE | INTRAVENOUS | Status: AC
  Filled 2019-06-30: qty 10

## 2019-06-30 MED ORDER — LIDOCAINE 2% (20 MG/ML) 5 ML SYRINGE
INTRAMUSCULAR | Status: AC
  Filled 2019-06-30: qty 5

## 2019-06-30 MED ORDER — HYDROMORPHONE HCL 1 MG/ML IJ SOLN: 1.0000 mg | INTRAMUSCULAR | Status: DC | PRN

## 2019-06-30 MED ORDER — ONDANSETRON HCL 4 MG/2ML IJ SOLN: 4.0000 mg | Freq: Once | INTRAMUSCULAR | Status: DC | PRN

## 2019-06-30 MED ORDER — BUPIVACAINE HCL 0.5 % IJ SOLN
INTRAMUSCULAR | Status: DC | PRN
  Administered 2019-06-30: 15 mL

## 2019-06-30 MED ORDER — KETOROLAC TROMETHAMINE 15 MG/ML IJ SOLN
15.0000 mg | Freq: Four times a day (QID) | INTRAMUSCULAR | Status: AC
  Administered 2019-06-30 – 2019-07-01 (×3): 15 mg via INTRAVENOUS
  Filled 2019-06-30 (×3): qty 1

## 2019-06-30 MED ORDER — ONDANSETRON HCL 4 MG PO TABS: 4.0000 mg | ORAL_TABLET | Freq: Four times a day (QID) | ORAL | Status: DC | PRN

## 2019-06-30 MED ORDER — BUPIVACAINE HCL (PF) 0.5 % IJ SOLN
INTRAMUSCULAR | Status: AC
  Filled 2019-06-30: qty 30

## 2019-06-30 MED ORDER — PHENYLEPHRINE 40 MCG/ML (10ML) SYRINGE FOR IV PUSH (FOR BLOOD PRESSURE SUPPORT)
PREFILLED_SYRINGE | INTRAVENOUS | Status: DC | PRN
  Administered 2019-06-30: 40 ug via INTRAVENOUS
  Administered 2019-06-30: 80 ug via INTRAVENOUS
  Administered 2019-06-30: 40 ug via INTRAVENOUS
  Administered 2019-06-30: 80 ug via INTRAVENOUS

## 2019-06-30 MED ORDER — CEFAZOLIN SODIUM-DEXTROSE 2-3 GM-%(50ML) IV SOLR
INTRAVENOUS | Status: DC | PRN
  Administered 2019-06-30: 2 g via INTRAVENOUS

## 2019-06-30 MED ORDER — FLUORESCEIN SODIUM 10 % IV SOLN
INTRAVENOUS | Status: AC
  Filled 2019-06-30: qty 5

## 2019-06-30 MED ORDER — PHENYLEPHRINE HCL-NACL 10-0.9 MG/250ML-% IV SOLN
INTRAVENOUS | Status: DC | PRN
  Administered 2019-06-30: 30 ug/min via INTRAVENOUS

## 2019-06-30 MED ORDER — OXYCODONE HCL 5 MG PO TABS: 5.0000 mg | ORAL_TABLET | Freq: Once | ORAL | Status: DC | PRN

## 2019-06-30 MED ORDER — DEXAMETHASONE SODIUM PHOSPHATE 10 MG/ML IJ SOLN
INTRAMUSCULAR | Status: DC | PRN
  Administered 2019-06-30: 10 mg via INTRAVENOUS

## 2019-06-30 MED ORDER — MIDAZOLAM HCL 5 MG/5ML IJ SOLN
INTRAMUSCULAR | Status: DC | PRN
  Administered 2019-06-30: 2 mg via INTRAVENOUS

## 2019-06-30 MED ORDER — FLUORESCEIN SODIUM 10 % IV SOLN
INTRAVENOUS | Status: DC | PRN
  Administered 2019-06-30: 50 mg via INTRAVENOUS

## 2019-06-30 MED ORDER — ONDANSETRON HCL 4 MG/2ML IJ SOLN
INTRAMUSCULAR | Status: AC
  Filled 2019-06-30: qty 2

## 2019-06-30 MED ORDER — ONDANSETRON HCL 4 MG/2ML IJ SOLN: 4.0000 mg | Freq: Four times a day (QID) | INTRAMUSCULAR | Status: DC | PRN

## 2019-06-30 MED ORDER — PROPOFOL 10 MG/ML IV BOLUS
INTRAVENOUS | Status: DC | PRN
  Administered 2019-06-30: 20 mg via INTRAVENOUS
  Administered 2019-06-30: 180 mg via INTRAVENOUS

## 2019-06-30 MED ORDER — SIMETHICONE 80 MG PO CHEW
80.0000 mg | CHEWABLE_TABLET | Freq: Three times a day (TID) | ORAL | Status: DC
  Administered 2019-06-30 – 2019-07-01 (×2): 80 mg via ORAL
  Filled 2019-06-30 (×2): qty 1

## 2019-06-30 MED ORDER — ROCURONIUM BROMIDE 10 MG/ML (PF) SYRINGE
PREFILLED_SYRINGE | INTRAVENOUS | Status: AC
  Filled 2019-06-30: qty 10

## 2019-06-30 MED ORDER — OXYCODONE HCL 5 MG/5ML PO SOLN: 5.0000 mg | Freq: Once | ORAL | Status: DC | PRN

## 2019-06-30 MED ORDER — LIDOCAINE 2% (20 MG/ML) 5 ML SYRINGE
INTRAMUSCULAR | Status: DC | PRN
  Administered 2019-06-30: 40 mg via INTRAVENOUS

## 2019-06-30 MED ORDER — ROCURONIUM BROMIDE 10 MG/ML (PF) SYRINGE
PREFILLED_SYRINGE | INTRAVENOUS | Status: DC | PRN
  Administered 2019-06-30: 20 mg via INTRAVENOUS
  Administered 2019-06-30: 10 mg via INTRAVENOUS
  Administered 2019-06-30: 50 mg via INTRAVENOUS

## 2019-06-30 MED ORDER — ONDANSETRON HCL 4 MG/2ML IJ SOLN
INTRAMUSCULAR | Status: DC | PRN
  Administered 2019-06-30: 4 mg via INTRAVENOUS

## 2019-06-30 MED ORDER — LACTATED RINGERS IV SOLN: INTRAVENOUS | Status: DC | PRN

## 2019-06-30 MED ORDER — FENTANYL CITRATE (PF) 100 MCG/2ML IJ SOLN
25.0000 ug | INTRAMUSCULAR | Status: DC | PRN
  Administered 2019-06-30 (×2): 50 ug via INTRAVENOUS

## 2019-06-30 SURGICAL SUPPLY — 52 items
ADH SKN CLS APL DERMABOND .7 (GAUZE/BANDAGES/DRESSINGS) ×2
BARRIER ADHS 3X4 INTERCEED (GAUZE/BANDAGES/DRESSINGS) ×3 IMPLANT
BLADE SURG 15 STRL LF DISP TIS (BLADE) ×2 IMPLANT
BLADE SURG 15 STRL SS (BLADE) ×3
BRR ADH 4X3 ABS CNTRL BYND (GAUZE/BANDAGES/DRESSINGS) ×2
CABLE HIGH FREQUENCY MONO STRZ (ELECTRODE) ×3 IMPLANT
CANISTER SUCT 3000ML PPV (MISCELLANEOUS) ×3 IMPLANT
DECANTER SPIKE VIAL GLASS SM (MISCELLANEOUS) ×3 IMPLANT
DEFOGGER SCOPE WARMER CLEARIFY (MISCELLANEOUS) ×3 IMPLANT
DERMABOND ADVANCED (GAUZE/BANDAGES/DRESSINGS) ×1
DERMABOND ADVANCED .7 DNX12 (GAUZE/BANDAGES/DRESSINGS) ×2 IMPLANT
DEVICE SUTURE ENDOST 10MM (ENDOMECHANICALS) ×1 IMPLANT
DRSG OPSITE POSTOP 3X4 (GAUZE/BANDAGES/DRESSINGS) ×2 IMPLANT
DURAPREP 26ML APPLICATOR (WOUND CARE) ×3 IMPLANT
GLOVE BIOGEL PI IND STRL 7.0 (GLOVE) ×4 IMPLANT
GLOVE BIOGEL PI IND STRL 7.5 (GLOVE) ×4 IMPLANT
GLOVE BIOGEL PI INDICATOR 7.0 (GLOVE) ×2
GLOVE BIOGEL PI INDICATOR 7.5 (GLOVE) ×2
GLOVE SURG SS PI 7.0 STRL IVOR (GLOVE) ×12 IMPLANT
GOWN STRL REUS W/ TWL LRG LVL3 (GOWN DISPOSABLE) ×6 IMPLANT
GOWN STRL REUS W/ TWL XL LVL3 (GOWN DISPOSABLE) ×4 IMPLANT
GOWN STRL REUS W/TWL LRG LVL3 (GOWN DISPOSABLE) ×9
GOWN STRL REUS W/TWL XL LVL3 (GOWN DISPOSABLE) ×6
HIBICLENS CHG 4% 4OZ BTL (MISCELLANEOUS) ×4 IMPLANT
KIT TURNOVER KIT B (KITS) ×3 IMPLANT
LIGASURE VESSEL 5MM BLUNT TIP (ELECTROSURGICAL) ×2 IMPLANT
MANIPULATOR VCARE LG CRV RETR (MISCELLANEOUS) ×1 IMPLANT
MANIPULATOR VCARE SML CRV RETR (MISCELLANEOUS) IMPLANT
MANIPULATOR VCARE STD CRV RETR (MISCELLANEOUS) IMPLANT
OCCLUDER COLPOPNEUMO (BALLOONS) ×3 IMPLANT
PACK LAPAROSCOPY BASIN (CUSTOM PROCEDURE TRAY) ×3 IMPLANT
PACK TRENDGUARD 450 HYBRID PRO (MISCELLANEOUS) ×1 IMPLANT
PAD ARMBOARD 7.5X6 YLW CONV (MISCELLANEOUS) ×2 IMPLANT
PAD OB MATERNITY 4.3X12.25 (PERSONAL CARE ITEMS) ×3 IMPLANT
POUCH LAPAROSCOPIC INSTRUMENT (MISCELLANEOUS) ×5 IMPLANT
SCISSORS LAP 5X35 DISP (ENDOMECHANICALS) ×3 IMPLANT
SET IRRIG TUBING LAPAROSCOPIC (IRRIGATION / IRRIGATOR) ×2 IMPLANT
SET IRRIG Y TYPE TUR BLADDER L (SET/KITS/TRAYS/PACK) ×2 IMPLANT
SET TUBE SMOKE EVAC HIGH FLOW (TUBING) ×3 IMPLANT
SOLUTION ELECTROLUBE (MISCELLANEOUS) ×1 IMPLANT
SUT ENDO VLOC 180-0-8IN (SUTURE) ×1 IMPLANT
SUT MNCRL AB 4-0 PS2 18 (SUTURE) ×6 IMPLANT
SUT VICRYL 0 UR6 27IN ABS (SUTURE) ×6 IMPLANT
SYR 50ML LL SCALE MARK (SYRINGE) ×3 IMPLANT
SYSTEM CARTER THOMASON II (TROCAR) ×1 IMPLANT
TOWEL GREEN STERILE FF (TOWEL DISPOSABLE) ×6 IMPLANT
TRAY FOLEY W/BAG SLVR 14FR LF (SET/KITS/TRAYS/PACK) ×3 IMPLANT
TRENDGUARD 450 HYBRID PRO PACK (MISCELLANEOUS) ×3
TROCAR ADV FIXATION 5X100MM (TROCAR) IMPLANT
TROCAR BALLN 12MMX100 BLUNT (TROCAR) ×1 IMPLANT
TROCAR XCEL NON-BLD 11X100MML (ENDOMECHANICALS) ×2 IMPLANT
UNDERPAD 30X36 HEAVY ABSORB (UNDERPADS AND DIAPERS) ×3 IMPLANT

## 2019-06-30 NOTE — Progress Notes (Signed)
Pt arrived to room 6N16 via bed after surgery. Received report from [employer], [place] in PACU. See assessment. Will continue to monitor.

## 2019-06-30 NOTE — Plan of Care (Signed)

## 2019-06-30 NOTE — Anesthesia Preprocedure Evaluation (Signed)

## 2019-06-30 NOTE — Progress Notes (Signed)
GYN Post Op Check Note  06/30/2019 - 3:02 PM  Brief Clinical History: 43 y.o. POD#0 s/p TLH/LS/cysto (EBL 50 mL) for painful, heavy periods.   PMHx significant for nothing.  Subjective: +ambulation to void, +voiding, no nausea/vomiting/flatus or eating yet. Taking some liquids. Having abdominal pain helped with PO  Objective:  Current Vital Signs 24h Vital Sign Ranges  T (!) 97.5 F (36.4 C) Temp  Avg: 97.9 F (36.6 C)  Min: 97.5 F (36.4 C)  Max: 98.8 F (37.1 C)  BP 119/77 BP  Min: 119/77  Max: 149/87  HR 81 Pulse  Avg: 86.2  Min: 78  Max: 103  RR 18 Resp  Avg: 18  Min: 14  Max: 20  SaO2 100 % Room Air SpO2  Avg: 99.4 %  Min: 95 %  Max: 100 %       24 Hour I/O Current Shift I/O  Time Ins Outs No intake/output data recorded. 12/29 0701 - 12/29 1900 In: 1050 [I.V.:1000] Out: 1200 [Urine:1150]  UOP: >117mL/hr  General: NAD Abdomen: rare BS, soft, approp ttp, mildly distended. C/d/i dressing x 2 and l/s port sites x 2 GU: no gross VB CV: S1, S2 normal, no murmur, rub or gallop, regular rate and rhythm Pulm: CTAB Extremities: no clubbing, cyanosis or edema; SCDs in place Neuro: A&O x 3  Medications: Current Facility-Administered Medications  Medication Dose Route Frequency Provider Last Rate Last Admin  . acetaminophen (TYLENOL) tablet 650 mg  650 mg Oral Q4H Aletha Halim, MD   650 mg at 06/30/19 1251  . fentaNYL (SUBLIMAZE) 100 MCG/2ML injection           . lactated ringers infusion   Intravenous Continuous Aletha Halim, MD 125 mL/hr at 06/30/19 1251 Restarted at 06/30/19 1251  . ondansetron (ZOFRAN) tablet 4 mg  4 mg Oral Q6H PRN Aletha Halim, MD       Or  . ondansetron (ZOFRAN) injection 4 mg  4 mg Intravenous Q6H PRN Aletha Halim, MD      . oxyCODONE (Oxy IR/ROXICODONE) immediate release tablet 5-10 mg  5-10 mg Oral Q6H PRN Aletha Halim, MD      . simethicone (MYLICON) chewable tablet 80 mg  80 mg Oral TID Melvern Sample, MD         Laboratory: no new labs  Recent Labs  Lab 06/24/19 0926  WBC 4.5  HGB 11.3*  HCT 36.3  PLT 292   Recent Labs  Lab 06/24/19 0926  NA 138  K 4.2  CL 105  CO2 25  BUN 13  CREATININE 0.98  CALCIUM 9.0  PROT 7.1  BILITOT 0.7  ALKPHOS 54  ALT 14  AST 19  GLUCOSE 97    Radiology: none  A/P: pt doing well post op *GYN: routine post op care. F/u final pathology *FEN/GI: regular diet, MIVF *PPx: SCDs, OOB ad lib *Pain: PO PRNs. Will schedule IV toradol and given some IV PRN for breakthrough overnight *Dispo: pt would like to stay o/n. Anticipate d/c in AM  Durene Romans. MD [phone] Attending Center for [facility])

## 2019-06-30 NOTE — Transfer of Care (Signed)
Immediate Anesthesia Transfer of Care Note  Patient: Anna Bradley  Procedure(s) Performed: TOTAL LAPAROSCOPIC HYSTERECTOMY WITH Left SALPINGECTOMY (Bilateral Abdomen) CYSTOSCOPY (N/A Urethra)  Patient Location: PACU  Anesthesia Type:General  Level of Consciousness: awake, alert  and oriented  Airway & Oxygen Therapy: Patient Spontanous Breathing  Post-op Assessment: Report given to RN, Post -op Vital signs reviewed and stable and Patient moving all extremities X 4  Post vital signs: Reviewed and stable  Last Vitals:  Vitals Value Taken Time  BP 132/76 06/30/19 1008  Temp    Pulse 104 06/30/19 1009  Resp 16 06/30/19 1009  SpO2 100 % 06/30/19 1009  Vitals shown include unvalidated device data.  Last Pain:  Vitals:   06/30/19 0651  TempSrc:   PainSc: 0-No pain         Complications: No apparent anesthesia complications

## 2019-06-30 NOTE — Discharge Instructions (Signed)
Laparoscopic Surgery Discharge Instructions  Instructions Following Major Laparoscopic Surgery You have just undergone a major laparoscopic surgery.  The following list should answer your most common questions.  Although we will discuss your surgery and post-operative instructions with you prior to your discharge, this list will serve as a reminder if you fail to recall the details of what we discussed.  We will discuss your surgery once again in detail at your post-op visit in two to four weeks. If you haven't already done so, please call to make your appointment as soon as possible.  How you will feel: Although you have just undergone a major surgery, your recovery will be significantly shorter since the surgery was performed through much smaller incisions than the traditional approach.  You should feel slightly better each day.  If you suddenly feel much worse than the prior day, please call the clinic.  It's important during the early part of your recovery that you maintain some activity.  Walking is encouraged.  You will quicken your recovery by continued activity.  Incision:  Your incisions will be closed with dissolvable stitches or surgical adhesive (glue).  There may be Band-aids and/or Steri-strips covering your incisions.  If there is no drainage from the incisions you may remove the Band-aids in one to two days.  You may notice some minor bruising at the incision sites.  This is common and will resolve within several days.  Please inform us if the redness at the edges of your incision appears to be spreading.  If the skin around your incision becomes warm to the touch, or if you notice a pus-like drainage, please call the office.  Vaginal Discharge Following a Laparoscopic Hysterectomy: Minor vaginal bleeding or spotting is normal following a hysterectomy.  Bleeding similar to the amount of your period is excessive, and you should inform us of this immediately.  Vaginal spotting may continue  for several weeks following your surgery.  You may notice a yellowish discharge which occasionally occurs as the vaginal stitches dissolve, and may last for several weeks.  Sexual Activity Following a Hysterectomy: Do not have sexual intercourse or place tampons or douches in the vagina prior to your first office visit.  We will discuss when you may resume these activities at that visit.    Stairs/Driving/Activities: You may climb stairs if necessary.  If you've had general anesthesia, do not drive a car the rest of the day today.  You may begin light housework when you feel up to it, but avoid heavy lifting (more than 15-20lbs) or pushing until cleared for these activities by your physician.  Hygiene:  Do not soak your incisions.  Showers are acceptable but you may not take a bath or swim in a pool.  Cleanse your incisions daily with soap and water.  Medications:  Please resume taking any medications that you were taking prior to the surgery.  If we have prescribed any new medications for you, please take them as directed.  Constipation:  It is fairly common to experience some difficulty in moving your bowels following major surgery.  Being active will help to reduce this likelihood. A diet rich in fiber and plenty of liquids is desirable.  If you do become constipated, a mild laxative such as Miralax, Milk of Magnesia, or Metamucil, or a stool softener such as Colace, is recommended.  General Instructions: If you develop a fever of 100.5 degrees or higher, please call the office number(s) below for physician on call.   

## 2019-06-30 NOTE — Anesthesia Postprocedure Evaluation (Signed)
Anesthesia Post Note  Patient: Anna Bradley  Procedure(s) Performed: TOTAL LAPAROSCOPIC HYSTERECTOMY WITH Left SALPINGECTOMY (Bilateral Abdomen) CYSTOSCOPY (N/A Urethra)     Patient location during evaluation: PACU Anesthesia Type: General Level of consciousness: awake and alert Pain management: pain level controlled Vital Signs Assessment: post-procedure vital signs reviewed and stable Respiratory status: spontaneous breathing, nonlabored ventilation, respiratory function stable and patient connected to nasal cannula oxygen Cardiovascular status: blood pressure returned to baseline and stable Postop Assessment: no apparent nausea or vomiting Anesthetic complications: no    Last Vitals:  Vitals:   06/30/19 1300 06/30/19 1400  BP: 119/77 128/80  Pulse: 81 98  Resp: 18 18  Temp: (!) 36.4 C 36.6 C  SpO2: 100% 100%    Last Pain:  Vitals:   06/30/19 1504  TempSrc:   PainSc: 9                  Martavius Lusty COKER

## 2019-06-30 NOTE — Op Note (Signed)
Operative Note   06/30/2019  PRE-OP DIAGNOSIS *Dysmenorrhea *Menorrhagia    POST-OP DIAGNOSIS *Same   SURGEON: Surgeon(s) and Role:    * Aletha Halim, MD - Primary   ASSISTANT:  Lavonia Drafts, MD - Assisting  An experienced assistant was required given the standard of surgical care given the complexity of the case. This assistant was needed for exposure, dissection, suctioning, retraction, instrument exchange, and overall help during the procedure    PROCEDURE: Total laparoscopic hysterectomy, left salpingectomy, cystoscopy  ANESTHESIA: General and local  ESTIMATED BLOOD LOSS: 51mL  DRAINS: indwelling foley 435mL UOP   TOTAL IV FLUIDS: 1914mL crystalloid  VTE PROPHYLAXIS: SCDs to the bilateral lower extremities  ANTIBIOTICS: Two grams of Cefazolin were given. within 1 hour of skin incision  SPECIMENS: cervix, uterus, left fallopian tube  DISPOSITION: PACU - hemodynamically stable.  CONDITION: stable  COMPLICATIONS: None  FINDINGS: Grossly normal uterus, ovaries, liver and stomach edge.  Right tube surgically absent. Left tube with evidence of prior BTL. On cystoscopy, normal bladder dome, urine and +yellow jets from bilateral ureteral orifices.   DESCRIPTION OF PROCEDURE: After informed consent was obtained, the patient was taken to the operating room where anesthesia was obtained without difficulty. The patient was positioned in the dorsal lithotomy position in [place] and her arms were carefully tucked at her sides and the usual precautions were taken.  She was prepped and draped in normal sterile fashion.  Time-out was performed and a Foley catheter was placed into the bladder. A standard VCare uterine manipulator was then placed in the uterus without incident. Gloves were then changed, and after injection of local anesthesia, the open technique was used to place an infraumbilical [id number] baloon trocar under direct visualization. The laparoscope was  introduced and CO2 gas was infused for pneumoperitoneum to a pressure of 15 mm Hg and the area below inspected for injury.  The patient was placed in Trendelenburg and the bowel was displaced up into the upper abdomen, and the right and left lateral 5-mm ports and an 11 mm suprapubic port were placed under direct visualization of the laparoscope, after injection of local anesthesia.    The bilateral round ligaments were then cauterized with the Ligasure device, and the round ligaments were divided on each side and the retroperitoneal space was opened bilaterally.  A bladder flap was created and the bladder was dissected down off the lower uterine segment and cervix using Ligasure.  The left mesosalpingx was then serially cauterized and transected and the tube, as well, and the tube removed and pieces with the prior tubal site being the divider; the right utero-ovarian ligament cauterized, transected and opened. The bilateral broad ligaments were then opened, and the uterine arteries were skeletonized bilaterally, sealed and divided with the Ligasure device.  A colpotomy was performed circumferentially along the ring with electrocautery and the cervix was incised from the vagina and the specimen was removed through the vagina.  A pneumo balloon was placed in the vagina and the vaginal cuff was then closed in a running continuous fashion using the EndoStitch technique with 2-0 V-Lock suture with careful attention to include the vaginal cuff angles and the vaginal mucosa within the closure. Fluorescin was given and cystoscopy was then performed with the above noted findings. The intraperitoneal pressure was dropped, and all planes of dissection, vascular pedicles and the vaginal cuff were found to be hemostatic.    The suprapubic trocar was removed and the fascia was closed with 0 Vicryl suture  using the carter thomason device. The lateral trocars were removed under visualization.   Before the umbilical trocar was  removed the CO2 gas was released.  The fascia there was closed with 0 vicryl suture in a figure of eight fashion.   The skin incision at the umbilicus was closed with a subcuticular stitch of 4-0 monocryl.  The remaining skin incisions were closed with Dermabond glue.  The patient tolerated the procedure well.  Sponge, lap and needle counts were correct x2.  The patient was taken to recovery room in excellent condition.  Durene Romans MD Attending [phone] Center for [place] [facility])

## 2019-06-30 NOTE — Plan of Care (Signed)

## 2019-06-30 NOTE — H&P (Signed)
Obstetrics & Gynecology Surgical H&P   Date of Surgery: 06/30/2019   [facility]: [facility] Primary Care Provider: Glenis Smoker  Reason for Admission: scheduled hysterectomy  History of Present Illness: Anna Bradley is a 43 y.o. [phone] (Patient's last menstrual period was 06/18/2019.), with the above CC. PMHx is significant for h/o BTL, h/o ectopic RS, BMI 22.  Patient followed by me for dysmenorrhea, menorrhagia and possible adenomyosis on ultrasound. H/o failed medical option and patient desired definitive surgical treatment.     ROS: A 12-point review of systems was performed and negative, except as stated in the above HPI.  OBGYN History: As per HPI. OB History  Gravida Para Term Preterm AB Living  4 3 3   1 3   SAB TAB Ectopic Multiple Live Births      1        # Outcome Date GA Lbr Len/2nd Weight Sex Delivery Anes PTL Lv  4 Ectopic           3 Term           2 Term           1 Term             Obstetric Comments  SVD x 3.      Past Medical History: Past Medical History:  Diagnosis Date  . ANEMIA, IRON DEFICIENCY, UNSPEC. 08/29/2006   Qualifier: Diagnosis of  By: Samara Snide    . ASCUS (atypical squamous cells of undetermined significance) on Pap smear 1999  . Ectopic pregnancy 2000  . Menorrhagia 2005   tx with ocps  . PAPANICOLAOU SMEAR, ABNORMAL 08/29/2006   Qualifier: Diagnosis of By: Samara Snide    . Trichomonas 2008    Past Surgical History: Past Surgical History:  Procedure Laterality Date  .  etopic pregnancy    . TUBAL LIGATION  2003  . UNILATERAL SALPINGECTOMY Right    ex-lap, RS for ectopic  . UNILATERAL SALPINGECTOMY Left    postpartum    Family History:  Family History  Problem Relation Age of Onset  . Hypertension Mother      Social History:  Social History   Socioeconomic History  . Marital status: Single    Spouse name: Not on file  . Number of children: Not on file  . Years of education: Not  on file  . Highest education level: Not on file  Occupational History  . Not on file  Tobacco Use  . Smoking status: Never Smoker  . Smokeless tobacco: Never Used  Substance and Sexual Activity  . Alcohol use: Yes    Alcohol/week: 1.0 standard drinks    Types: 1 Glasses of wine per week  . Drug use: No  . Sexual activity: Yes    Partners: Male    Birth control/protection: Surgical  Other Topics Concern  . Not on file  Social History Narrative   Works as travel agent   3 boys   Lives with children   Social Determinants of Health   Financial Resource Strain:   . Difficulty of Paying Living Expenses: Not on file  Food Insecurity:   . Worried About Charity fundraiser in the Last Year: Not on file  . Ran Out of Food in the Last Year: Not on file  Transportation Needs:   . Lack of Transportation (Medical): Not on file  . Lack of Transportation (Non-Medical): Not on file  Physical Activity:   . Days of Exercise  per Week: Not on file  . Minutes of Exercise per Session: Not on file  Stress:   . Feeling of Stress : Not on file  Social Connections:   . Frequency of Communication with Friends and Family: Not on file  . Frequency of Social Gatherings with Friends and Family: Not on file  . Attends Religious Services: Not on file  . Active Member of Clubs or Organizations: Not on file  . Attends Archivist Meetings: Not on file  . Marital Status: Not on file  Intimate Partner Violence:   . Fear of Current or Ex-Partner: Not on file  . Emotionally Abused: Not on file  . Physically Abused: Not on file  . Sexually Abused: Not on file    Allergy: No Known Allergies  Current Outpatient Medications: No medications prior to admission.     Hospital Medications: Current Facility-Administered Medications  Medication Dose Route Frequency Provider Last Rate Last Admin  . lactated ringers infusion   Intravenous Continuous Aletha Halim, MD         Physical  Exam:   Current Vital Signs 24h Vital Sign Ranges  T 98.8 F (37.1 C) Temp  Avg: 98.8 F (37.1 C)  Min: 98.8 F (37.1 C)  Max: 98.8 F (37.1 C)  BP 138/87 BP  Min: 138/87  Max: 138/87  HR 95 Pulse  Avg: 95  Min: 95  Max: 95  RR 18 Resp  Avg: 18  Min: 18  Max: 18  SaO2 100 % Room Air SpO2  Avg: 100 %  Min: 100 %  Max: 100 %       24 Hour I/O Current Shift I/O  Time Ins Outs No intake/output data recorded. No intake/output data recorded.   Patient Vitals for the past 24 hrs:  BP Temp Temp src Pulse Resp SpO2 Height Weight  06/30/19 0602 138/87 98.8 F (37.1 C) Oral 95 18 100 % 5\' 5"  (1.651 m) 62.6 kg    Body mass index is 22.96 kg/m. General appearance: Well nourished, well developed female in no acute distress.  Cardiovascular: S1, S2 normal, no murmur, rub or gallop, regular rate and rhythm Respiratory:  Clear to auscultation bilateral. Normal respiratory effort Abdomen: positive bowel sounds and no masses, hernias; diffusely non tender to palpation, non distended Neuro/Psych:  Normal mood and affect.  Skin:  Warm and dry.  Extremities: no clubbing, cyanosis, or edema.   From 02/25/2019  Pelvic exam: is not limited by body habitus EGBUS: within normal limits, Vagina: within normal limits and with no blood or discharge in the vault, narrow introitus, Cervix: normal appearing cervix without tenderness, discharge or lesions. Uterus:  Nonenlarged, 8wk sized uterus, mobile and non tender and Adnexa:  normal adnexa and no mass, fullness, tenderness Rectovaginal: deferred   Laboratory: Negative; UPT, covid Recent Labs  Lab 06/24/19 0926  WBC 4.5  HGB 11.3*  HCT 36.3  PLT 292   Recent Labs  Lab 06/24/19 0926  NA 138  K 4.2  CL 105  CO2 25  BUN 13  CREATININE 0.98  CALCIUM 9.0  PROT 7.1  BILITOT 0.7  ALKPHOS 54  ALT 14  AST 19  GLUCOSE 97   No results for input(s): APTT, INR, PTT in the last 168 hours.  Invalid input(s): DRHAPTT Recent Labs  Lab  06/24/19 0932  ABORH A POS Performed at [facility] [address] [place], [place] [address]     Imaging:  CLINICAL DATA:  Patient  with dysmenorrhea.  Menorrhagia.  EXAM: TRANSABDOMINAL AND TRANSVAGINAL ULTRASOUND OF PELVIS  TECHNIQUE: Both transabdominal and transvaginal ultrasound examinations of the pelvis were performed. Transabdominal technique was performed for global imaging of the pelvis including uterus, ovaries, adnexal regions, and pelvic cul-de-sac. It was necessary to proceed with endovaginal exam following the transabdominal exam to visualize the endometrium.  COMPARISON:  None  FINDINGS: Uterus  Measurements: 9.7 x 5.3 x 6.2 cm = volume: 168 mL. The uterus is retroverted. Heterogeneity of the myometrium with posterior acoustic shadowing.  Endometrium  Thickness: 5.5 mm.  No focal abnormality visualized.  Right ovary  Measurements: 3.7 x 2.3 x 2.4 cm = volume: 10.7 mL. Normal appearance/no adnexal mass. Corpus luteum.  Left ovary  Measurements: 4.3 x 1.0 x 2.3 cm = volume: 5.1 mL. Normal appearance/no adnexal mass.  Other findings  Small right adnexal free fluid.  IMPRESSION: Heterogeneity of the myometrium raising the possibility of adenomyosis.  Endometrium measures 5 mm. If bleeding remains unresponsive to hormonal or medical therapy, sonohysterogram should be considered for focal lesion work-up. (Ref: Radiological Reasoning: Algorithmic Workup of Abnormal Vaginal Bleeding with Endovaginal Sonography and Sonohysterography. AJR [id number])   Electronically Signed   By: Lovey Newcomer M.D.   On: 01/29/2019 13:14  Assessment: Anna Bradley is a 43 y.o. [phone] (Patient's last menstrual period was 06/18/2019.) here for scheduled hysterectomy. Pt doing well Plan: D/w her re: plan for TLH/bilateral salpingectomy/cystoscopy. D/w her re: option to go home later today and will check on her later today and see how  she's doing and if she'd like to go home later today.   Can proceed when OR is ready  Durene Romans. MD [phone] Attending Center for [facility])

## 2019-06-30 NOTE — Anesthesia Procedure Notes (Signed)
Procedure Name: Intubation Date/Time: 06/30/2019 7:39 AM Performed by: Harden Mo, CRNA Pre-anesthesia Checklist: Patient identified, Emergency Drugs available, Suction available and Patient being monitored Patient Re-evaluated:Patient Re-evaluated prior to induction Oxygen Delivery Method: Circle System Utilized Preoxygenation: Pre-oxygenation with 100% oxygen Induction Type: IV induction Ventilation: Mask ventilation without difficulty Laryngoscope Size: Miller and 2 Grade View: Grade I Tube type: Oral Tube size: 7.0 mm Number of attempts: 1 Airway Equipment and Method: Stylet and Oral airway Placement Confirmation: ETT inserted through vocal cords under direct vision,  positive ETCO2 and breath sounds checked- equal and bilateral Secured at: 21 cm Tube secured with: Tape Dental Injury: Teeth and Oropharynx as per pre-operative assessment

## 2019-07-01 DIAGNOSIS — N946 Dysmenorrhea, unspecified: Secondary | ICD-10-CM | POA: Diagnosis not present

## 2019-07-01 LAB — SURGICAL PATHOLOGY

## 2019-07-01 MED ORDER — ACETAMINOPHEN 325 MG PO TABS: 650.0000 mg | ORAL_TABLET | Freq: Four times a day (QID) | ORAL | 0 refills | Status: DC | PRN

## 2019-07-01 MED ORDER — DOCUSATE SODIUM 100 MG PO CAPS: 100.0000 mg | ORAL_CAPSULE | Freq: Two times a day (BID) | ORAL | 1 refills | Status: AC | PRN

## 2019-07-01 MED ORDER — IBUPROFEN 600 MG PO TABS: 600.0000 mg | ORAL_TABLET | Freq: Four times a day (QID) | ORAL | 0 refills | Status: DC | PRN

## 2019-07-01 MED ORDER — OXYCODONE HCL 5 MG PO TABS: 5.0000 mg | ORAL_TABLET | Freq: Four times a day (QID) | ORAL | 0 refills | Status: DC | PRN

## 2019-07-01 MED ORDER — SIMETHICONE 80 MG PO CHEW: 80.0000 mg | CHEWABLE_TABLET | Freq: Three times a day (TID) | ORAL | 1 refills | Status: DC

## 2019-07-01 NOTE — Progress Notes (Signed)
AVS given and reviewed with pt. Medications discussed. All questions answered to satisfaction. Pt verbalized understanding of information given. Pt to be escorted off the unit with all belongings via wheelchair by volunteer services. 

## 2019-07-01 NOTE — Discharge Summary (Addendum)
Gynecology Discharge Summary Date of Admission: 06/30/2019 Date of Discharge: 07/01/2019  The patient was admitted, as scheduled, and underwent a TLH/LS/cysto; please refer to operative note for full details.  She was meeting all post op goals and discharged to home on POD#1. Unchanged exam, +flatus  Allergies as of 07/01/2019   No Known Allergies     Medication List    TAKE these medications   acetaminophen 325 MG tablet Commonly known as: TYLENOL Take 2 tablets (650 mg total) by mouth every 6 (six) hours as needed.   docusate sodium 100 MG capsule Commonly known as: COLACE Take 1 capsule (100 mg total) by mouth 2 (two) times daily as needed for up to 14 days.   ibuprofen 600 MG tablet Commonly known as: ADVIL Take 1 tablet (600 mg total) by mouth every 6 (six) hours as needed.   oxyCODONE 5 MG immediate release tablet Commonly known as: Oxy IR/ROXICODONE Take 1 tablet (5 mg total) by mouth every 6 (six) hours as needed for moderate pain.   simethicone 80 MG chewable tablet Commonly known as: MYLICON Chew 1 tablet (80 mg total) by mouth 3 (three) times daily before meals for 5 days.       No future appointments. Request made for 6 week post op visit  Durene Romans. MD Attending Center for [facility])

## 2019-07-01 NOTE — Plan of Care (Signed)
  Problem: Education: Goal: Knowledge of General Education information will improve Description: Including pain rating scale, medication(s)/side effects and non-pharmacologic comfort measures Outcome: Progressing   Problem: Health Behavior/Discharge Planning: Goal: Ability to manage health-related needs will improve Outcome: Progressing   Problem: Clinical Measurements: Goal: Ability to maintain clinical measurements within normal limits will improve Outcome: Progressing   Problem: Activity: Goal: Risk for activity intolerance will decrease Outcome: Progressing   Problem: Coping: Goal: Level of anxiety will decrease Outcome: Progressing   Problem: Elimination: Goal: Will not experience complications related to urinary retention Outcome: Progressing   Problem: Pain Managment: Goal: General experience of comfort will improve Outcome: Progressing   Problem: Safety: Goal: Ability to remain free from injury will improve Outcome: Progressing   Problem: Skin Integrity: Goal: Risk for impaired skin integrity will decrease Outcome: Progressing   

## 2019-07-22 ENCOUNTER — Encounter: Payer: Self-pay | Admitting: Obstetrics and Gynecology

## 2019-07-22 ENCOUNTER — Other Ambulatory Visit: Payer: Self-pay

## 2019-07-22 ENCOUNTER — Ambulatory Visit: Payer: Commercial Managed Care - PPO | Admitting: Obstetrics and Gynecology

## 2019-07-22 VITALS — BP 150/92 | HR 103 | Wt 136.4 lb

## 2019-07-22 DIAGNOSIS — R030 Elevated blood-pressure reading, without diagnosis of hypertension: Secondary | ICD-10-CM

## 2019-07-22 DIAGNOSIS — N760 Acute vaginitis: Secondary | ICD-10-CM

## 2019-07-22 MED ORDER — FLUCONAZOLE 150 MG PO TABS: 150.0000 mg | ORAL_TABLET | ORAL | 0 refills | Status: AC

## 2019-07-22 MED ORDER — METRONIDAZOLE 500 MG PO TABS: 500.0000 mg | ORAL_TABLET | Freq: Two times a day (BID) | ORAL | 0 refills | Status: AC

## 2019-07-23 LAB — CMP AND LIVER
ALT: 8 IU/L (ref 0–32)
AST: 11 IU/L (ref 0–40)
Albumin: 4.3 g/dL (ref 3.8–4.8)
Alkaline Phosphatase: 67 IU/L (ref 39–117)
BUN: 9 mg/dL (ref 6–24)
Bilirubin Total: 0.3 mg/dL (ref 0.0–1.2)
Bilirubin, Direct: 0.09 mg/dL (ref 0.00–0.40)
CO2: 24 mmol/L (ref 20–29)
Calcium: 9.6 mg/dL (ref 8.7–10.2)
Chloride: 104 mmol/L (ref 96–106)
Creatinine, Ser: 0.69 mg/dL (ref 0.57–1.00)
GFR calc Af Amer: 122 mL/min/{1.73_m2} (ref >59)
GFR calc non Af Amer: 106 mL/min/{1.73_m2} (ref >59)
Glucose: 120 mg/dL — ABNORMAL HIGH (ref 65–99)
Potassium: 4 mmol/L (ref 3.5–5.2)
Sodium: 139 mmol/L (ref 134–144)
Total Protein: 7.1 g/dL (ref 6.0–8.5)

## 2019-07-23 LAB — CBC
Hematocrit: 35 % (ref 34.0–46.6)
Hemoglobin: 10.9 g/dL — ABNORMAL LOW (ref 11.1–15.9)
MCH: 23.8 pg — ABNORMAL LOW (ref 26.6–33.0)
MCHC: 31.1 g/dL — ABNORMAL LOW (ref 31.5–35.7)
MCV: 76 fL — ABNORMAL LOW (ref 79–97)
Platelets: 414 10*3/uL (ref 150–450)
RBC: 4.58 x10E6/uL (ref 3.77–5.28)
RDW: 15.7 % — ABNORMAL HIGH (ref 11.7–15.4)
WBC: 6.6 10*3/uL (ref 3.4–10.8)

## 2019-07-23 LAB — TSH: TSH: 1.4 u[IU]/mL (ref 0.450–4.500)

## 2019-07-23 NOTE — Progress Notes (Signed)
[facility] 07/22/2019  CC: regular post op visit  Subjective:   Pt is s/p 12/29 TLH/LS/cysto for painful, heavy bleeding. She was discharged on POD#1  Not having any issues or problems. Nothing per vagina   Review of Systems Pertinent items are noted in HPI.    Objective:    BP (!) 150/92   Pulse (!) 103   Wt 136 lb 6.4 oz (61.9 kg)   BMI 22.70 kg/m  NAD Well healed l/s port sites EGBUS normal. Vaginal vault: normal, +cottage cheese like d/c with some irritation with speculum. Cuff: appears well healed, nttp, well approximated  Assessment:    Doing well postoperatively. Operative findings again reviewed. Pathology report discussed.    Plan:   Flagyl and diflucan Recommend full two months of pelvic rest s/p surgery to fully heal  Recommend call PCP for annual exam and f/u HTN  RTC PRN  Durene Romans MD Attending Center for [facility])

## 2019-07-27 ENCOUNTER — Encounter: Payer: Self-pay | Admitting: Obstetrics and Gynecology

## 2020-03-17 ENCOUNTER — Encounter: Payer: Self-pay | Admitting: Family Medicine

## 2020-03-17 ENCOUNTER — Other Ambulatory Visit: Payer: Self-pay

## 2020-03-17 ENCOUNTER — Ambulatory Visit (INDEPENDENT_AMBULATORY_CARE_PROVIDER_SITE_OTHER): Payer: Commercial Managed Care - PPO | Admitting: Family Medicine

## 2020-03-17 VITALS — BP 122/80 | HR 88 | Ht 65.0 in | Wt 138.2 lb

## 2020-03-17 DIAGNOSIS — Z0000 Encounter for general adult medical examination without abnormal findings: Secondary | ICD-10-CM

## 2020-03-17 NOTE — Patient Instructions (Signed)
Return for labs on Mon 20 at 830 am  You will need to fast after midnight.  I will call you when your forms are completed for pick up   Carollee Leitz, MD [facility] Medicine Residency

## 2020-03-20 ENCOUNTER — Encounter: Payer: Self-pay | Admitting: Family Medicine

## 2020-03-20 DIAGNOSIS — Z0000 Encounter for general adult medical examination without abnormal findings: Secondary | ICD-10-CM | POA: Insufficient documentation

## 2020-03-20 DIAGNOSIS — Z124 Encounter for screening for malignant neoplasm of cervix: Secondary | ICD-10-CM | POA: Insufficient documentation

## 2020-03-20 NOTE — Progress Notes (Signed)
    SUBJECTIVE:   CHIEF COMPLAINT / HPI: requesting physical for work  No acute concerns.    PERTINENT  PMH / PSH:  None  OBJECTIVE:   BP 122/80   Pulse 88   Ht 5\' 5"  (1.651 m)   Wt 138 lb 3.2 oz (62.7 kg)   SpO2 100%   BMI 23.00 kg/m    General: Alert and oriented, no apparent distress  Eyes: PEERLA ENTM: No pharyengeal erythema Neck: nontender Cardiovascular: RRR with no murmurs noted Respiratory: CTA bilaterally  Gastrointestinal: Bowel sounds present. No abdominal pain MSK: Upper extremity strength 5/5 bilaterally, Lower extremity strength 5/5 bilaterally  Derm: No rashes noted Psych: Behavior and speech appropriate to situation  ASSESSMENT/PLAN:   Encounter for physical examination Healthy 44 yr old femal.  Benign exam. -Forms need blood work prior to completion -Fasting glucose and lipids ordered.   -Will complete forms when labs resulted and call patient for pick up. -Follow up with PCP as needed     Anna Leitz, MD [place]

## 2020-03-20 NOTE — Assessment & Plan Note (Signed)
Healthy 44 yr old femal.  Benign exam. -Forms need blood work prior to completion -Fasting glucose and lipids ordered.   -Will complete forms when labs resulted and call patient for pick up. -Follow up with PCP as needed

## 2020-03-21 ENCOUNTER — Other Ambulatory Visit: Payer: Self-pay

## 2020-03-21 ENCOUNTER — Other Ambulatory Visit: Payer: Self-pay | Admitting: Family Medicine

## 2020-03-21 ENCOUNTER — Other Ambulatory Visit: Payer: Commercial Managed Care - PPO

## 2020-03-21 DIAGNOSIS — Z0000 Encounter for general adult medical examination without abnormal findings: Secondary | ICD-10-CM

## 2020-03-22 ENCOUNTER — Other Ambulatory Visit (INDEPENDENT_AMBULATORY_CARE_PROVIDER_SITE_OTHER): Payer: Commercial Managed Care - PPO

## 2020-03-22 ENCOUNTER — Other Ambulatory Visit: Payer: Self-pay

## 2020-03-22 DIAGNOSIS — Z0000 Encounter for general adult medical examination without abnormal findings: Secondary | ICD-10-CM

## 2020-03-22 LAB — LIPID PANEL
Chol/HDL Ratio: 2.6 ratio (ref 0.0–4.4)
Cholesterol, Total: 180 mg/dL (ref 100–199)
HDL: 70 mg/dL (ref >39)
LDL Chol Calc (NIH): 97 mg/dL (ref 0–99)
Triglycerides: 72 mg/dL (ref 0–149)
VLDL Cholesterol Cal: 13 mg/dL (ref 5–40)

## 2020-03-22 LAB — POCT CBG (FASTING - GLUCOSE)-MANUAL ENTRY: Glucose Fasting, POC: 109 mg/dL — AB (ref 70–99)

## 2020-03-24 ENCOUNTER — Telehealth: Payer: Self-pay | Admitting: Family Medicine

## 2020-03-24 NOTE — Telephone Encounter (Signed)
Physical forms completed and placed in front office for patient pick up.  Carollee Leitz, MD Family Medicine Residency

## 2020-03-28 NOTE — Telephone Encounter (Signed)
Patient informed. Alvia Tory T Seon Gaertner, CMA  

## 2020-07-28 ENCOUNTER — Ambulatory Visit (INDEPENDENT_AMBULATORY_CARE_PROVIDER_SITE_OTHER): Payer: Commercial Managed Care - PPO | Admitting: Family Medicine

## 2020-07-28 ENCOUNTER — Encounter: Payer: Self-pay | Admitting: Family Medicine

## 2020-07-28 ENCOUNTER — Other Ambulatory Visit: Payer: Self-pay

## 2020-07-28 VITALS — BP 104/70 | HR 78 | Wt 135.0 lb

## 2020-07-28 DIAGNOSIS — Z1211 Encounter for screening for malignant neoplasm of colon: Secondary | ICD-10-CM

## 2020-07-28 DIAGNOSIS — M26609 Unspecified temporomandibular joint disorder, unspecified side: Secondary | ICD-10-CM

## 2020-07-28 DIAGNOSIS — S0300XA Dislocation of jaw, unspecified side, initial encounter: Secondary | ICD-10-CM | POA: Diagnosis not present

## 2020-07-28 NOTE — Assessment & Plan Note (Deleted)
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  She was encouraged to use NSAIDs like ibuprofen to help with the discomfort and also try wearing a mouthguard at night.  She was encouraged also follow-up with a dentist if these measures were not helpful. 

## 2020-07-28 NOTE — Assessment & Plan Note (Signed)
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  She was encouraged to use NSAIDs like ibuprofen to help with the discomfort and also try wearing a mouthguard at night.  She was encouraged also follow-up with a dentist if these measures were not helpful.

## 2020-07-28 NOTE — Progress Notes (Signed)
    SUBJECTIVE:   CHIEF COMPLAINT / HPI:   TMJ Ms. Montelongo notes that she has been having ongoing right-sided jaw pain for roughly the past 2 years since she had one of her right molars removed.  She notes it seems to come and go and episodes.  It has been worse for roughly the past 4 months.  She typically takes Advil for this pain which usually helps.  She notices this pain is worse if she is lying on her right side at night or while chewing.  She denies any trouble with hearing or ear drainage.  She has had no recent fevers or congestion or cough.  No trouble swallowing.  No specific dental pain.  PERTINENT  PMH / PSH: Noncontributory  OBJECTIVE:   BP 104/70   Pulse 78   Wt 135 lb (61.2 kg)   SpO2 98%   BMI 22.47 kg/m    General: Alert and cooperative and appears to be in no acute distress HEENT: Normal TMs visualized bilaterally.  No significant pain with examination of the outer ear.  Oropharynx normal.  Dental fillings noted without any evidence of tooth pain with percussion of the molars on the right side.  No tenderness with palpation of the gumline on the right side.  Mild tenderness with palpation of the right TMJ.  Increased discomfort with opening and closing of the jaw. Extremities: No peripheral edema. Warm/ well perfused.  Strong radial pulses. 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  She was encouraged to use NSAIDs like ibuprofen to help with the discomfort and also try wearing a mouthguard at night.  She was encouraged also follow-up with a dentist if these measures were not helpful.   Healthcare maintenance -Placed referral for colonoscopy   Matilde Haymaker, MD [place]

## 2020-07-28 NOTE — Patient Instructions (Signed)

## 2020-10-19 ENCOUNTER — Encounter: Payer: Commercial Managed Care - PPO | Admitting: Internal Medicine

## 2020-11-29 ENCOUNTER — Ambulatory Visit (AMBULATORY_SURGERY_CENTER): Payer: Commercial Managed Care - PPO | Admitting: *Deleted

## 2020-11-29 ENCOUNTER — Other Ambulatory Visit: Payer: Self-pay

## 2020-11-29 VITALS — Ht 64.0 in | Wt 128.0 lb

## 2020-11-29 DIAGNOSIS — Z1211 Encounter for screening for malignant neoplasm of colon: Secondary | ICD-10-CM

## 2020-11-29 MED ORDER — SUTAB 1479-225-188 MG PO TABS: 1.0000 | ORAL_TABLET | Freq: Once | ORAL | 0 refills | Status: AC

## 2020-11-29 NOTE — Progress Notes (Addendum)
No egg or soy allergy known to patient  No issues with past sedation with any surgeries or procedures Patient denies ever being told they had issues or difficulty with intubation  No FH of Malignant Hyperthermia No diet pills per patient No home 02 use per patient  No blood thinners per patient  Pt denies issues with constipation  No A fib or A flutter  EMMI video to pt or via [place] 19 guidelines implemented in [place] today with Pt and RN   Virtual pre visit completed  Instructions sent through [place] and secure email [email or web address]    Due to the COVID-19 pandemic we are asking patients to follow certain guidelines.  Pt aware of COVID protocols and [facility] guidelines

## 2020-12-02 ENCOUNTER — Telehealth: Payer: Self-pay | Admitting: Internal Medicine

## 2020-12-02 DIAGNOSIS — Z1211 Encounter for screening for malignant neoplasm of colon: Secondary | ICD-10-CM

## 2020-12-02 MED ORDER — NA SULFATE-K SULFATE-MG SULF 17.5-3.13-1.6 GM/177ML PO SOLN: ORAL | 0 refills | Status: DC

## 2020-12-02 NOTE — Telephone Encounter (Signed)
Patient calling requesting SUTAB prep pills in liquid form.  Plz advise thanks

## 2020-12-02 NOTE — Telephone Encounter (Signed)
Suprep Rx sent to pt's pharmacy and new suprep prep instructions sent to Mychart-pt is aware.

## 2020-12-13 ENCOUNTER — Encounter: Payer: Self-pay | Admitting: Internal Medicine

## 2020-12-13 ENCOUNTER — Ambulatory Visit (AMBULATORY_SURGERY_CENTER): Payer: Commercial Managed Care - PPO | Admitting: Internal Medicine

## 2020-12-13 ENCOUNTER — Other Ambulatory Visit: Payer: Self-pay

## 2020-12-13 VITALS — BP 120/89 | HR 82 | Temp 98.1°F | Resp 14 | Ht 65.0 in | Wt 128.0 lb

## 2020-12-13 DIAGNOSIS — D124 Benign neoplasm of descending colon: Secondary | ICD-10-CM | POA: Diagnosis not present

## 2020-12-13 DIAGNOSIS — Z1211 Encounter for screening for malignant neoplasm of colon: Secondary | ICD-10-CM

## 2020-12-13 MED ORDER — SODIUM CHLORIDE 0.9 % IV SOLN: 500.0000 mL | Freq: Once | INTRAVENOUS | Status: DC

## 2020-12-13 NOTE — Progress Notes (Signed)
VS-CW  Pt's states no medical or surgical changes since previsit or office visit.  

## 2020-12-13 NOTE — Patient Instructions (Signed)
Handout on polyps given. ° °YOU HAD AN ENDOSCOPIC PROCEDURE TODAY AT THE [facility]:   Refer to the procedure report that was given to you for any specific questions about what was found during the examination.  If the procedure report does not answer your questions, please call your gastroenterologist to clarify.  If you requested that your care partner not be given the details of your procedure findings, then the procedure report has been included in a sealed envelope for you to review at your convenience later. ° °YOU SHOULD EXPECT: Some feelings of bloating in the abdomen. Passage of more gas than usual.  Walking can help get rid of the air that was put into your GI tract during the procedure and reduce the bloating. If you had a lower endoscopy (such as a colonoscopy or flexible sigmoidoscopy) you may notice spotting of blood in your stool or on the toilet paper. If you underwent a bowel prep for your procedure, you may not have a normal bowel movement for a few days. ° °Please Note:  You might notice some irritation and congestion in your nose or some drainage.  This is from the oxygen used during your procedure.  There is no need for concern and it should clear up in a day or so. ° °SYMPTOMS TO REPORT IMMEDIATELY: ° °Following lower endoscopy (colonoscopy or flexible sigmoidoscopy): ° Excessive amounts of blood in the stool ° Significant tenderness or worsening of abdominal pains ° Swelling of the abdomen that is new, acute ° Fever of 100°F or higher ° °For urgent or emergent issues, a gastroenterologist can be reached at any hour by calling [phone]. °Do not use MyChart messaging for urgent concerns.  ° ° °DIET:  We do recommend a small meal at first, but then you may proceed to your regular diet.  Drink plenty of fluids but you should avoid alcoholic beverages for 24 hours. ° °ACTIVITY:  You should plan to take it easy for the rest of today and you should NOT DRIVE or use heavy machinery  until tomorrow (because of the sedation medicines used during the test).   ° °FOLLOW UP: °Our staff will call the number listed on your records 48-72 hours following your procedure to check on you and address any questions or concerns that you may have regarding the information given to you following your procedure. If we do not reach you, we will leave a message.  We will attempt to reach you two times.  During this call, we will ask if you have developed any symptoms of COVID 19. If you develop any symptoms (ie: fever, flu-like symptoms, shortness of breath, cough etc.) before then, please call [phone].  If you test positive for Covid 19 in the 2 weeks post procedure, please call and report this information to us.   ° °If any biopsies were taken you will be contacted by phone or by letter within the next 1-3 weeks.  Please call us at [phone] if you have not heard about the biopsies in 3 weeks.  ° ° °SIGNATURES/CONFIDENTIALITY: °You and/or your care partner have signed paperwork which will be entered into your electronic medical record.  These signatures attest to the fact that that the information above on your After Visit Summary has been reviewed and is understood.  Full responsibility of the confidentiality of this discharge information lies with you and/or your care-partner.  °

## 2020-12-13 NOTE — Op Note (Signed)
[place] Patient Name: Anna Bradley Procedure Date: 12/13/2020 8:59 AM MRN: [id number] Endoscopist: Jerene Bears , MD Age: 45 Referring MD:  Date of Birth: 07-Apr-1976 Gender: Female Account #: [id number] Procedure:                Colonoscopy Indications:              Screening for colorectal malignant neoplasm, This                            is the patient's first colonoscopy Medicines:                Monitored Anesthesia Care Procedure:                Pre-Anesthesia Assessment:                           - Prior to the procedure, a History and Physical                            was performed, and patient medications and                            allergies were reviewed. The patient's tolerance of                            previous anesthesia was also reviewed. The risks                            and benefits of the procedure and the sedation                            options and risks were discussed with the patient.                            All questions were answered, and informed consent                            was obtained. Prior Anticoagulants: The patient has                            taken no previous anticoagulant or antiplatelet                            agents. ASA Grade Assessment: II - A patient with                            mild systemic disease. After reviewing the risks                            and benefits, the patient was deemed in                            satisfactory condition to undergo the procedure.  After obtaining informed consent, the colonoscope                            was passed under direct vision. Throughout the                            procedure, the patient's blood pressure, pulse, and                            oxygen saturations were monitored continuously. The                            Olympus PFC-H190DL [id number]) Colonoscope was                            introduced through the  anus and advanced to the                            cecum, identified by appendiceal orifice and                            ileocecal valve. The colonoscopy was performed                            without difficulty. The patient tolerated the                            procedure well. The quality of the bowel                            preparation was good. The ileocecal valve,                            appendiceal orifice, and rectum were photographed. Scope In: 9:11:12 AM Scope Out: 9:27:32 AM Scope Withdrawal Time: 0 hours 11 minutes 34 seconds  Total Procedure Duration: 0 hours 16 minutes 20 seconds  Findings:                 The digital rectal exam was normal.                           Two sessile polyps were found in the descending                            colon. The polyps were 2 to 5 mm in size. These                            polyps were removed with a cold snare. Resection                            and retrieval were complete.                           The exam was otherwise without abnormality on  direct and retroflexion views. Complications:            No immediate complications. Estimated Blood Loss:     Estimated blood loss was minimal. Impression:               - Two 2 to 5 mm polyps in the descending colon,                            removed with a cold snare. Resected and retrieved.                           - The examination was otherwise normal on direct                            and retroflexion views. Recommendation:           - Patient has a contact number available for                            emergencies. The signs and symptoms of potential                            delayed complications were discussed with the                            patient. Return to normal activities tomorrow.                            Written discharge instructions were provided to the                            patient.                           -  Resume previous diet.                           - Continue present medications.                           - Await pathology results.                           - Repeat colonoscopy is recommended. The                            colonoscopy date will be determined after pathology                            results from today's exam become available for                            review. Jerene Bears, MD 12/13/2020 9:29:07 AM This report has been signed electronically.

## 2020-12-13 NOTE — Progress Notes (Signed)
Called to room to assist during endoscopic procedure.  Patient ID and intended procedure confirmed with present staff. Received instructions for my participation in the procedure from the performing physician.  

## 2020-12-13 NOTE — Progress Notes (Signed)
pt tolerated well. VSS. awake and to recovery. Report given to RN.  

## 2020-12-15 ENCOUNTER — Telehealth: Payer: Self-pay | Admitting: *Deleted

## 2020-12-15 ENCOUNTER — Telehealth: Payer: Self-pay

## 2020-12-15 NOTE — Telephone Encounter (Signed)
LVM

## 2020-12-15 NOTE — Telephone Encounter (Signed)
Attempted f/u phone call. No answer. Left message. °

## 2020-12-18 ENCOUNTER — Encounter: Payer: Self-pay | Admitting: Internal Medicine

## 2021-03-09 IMAGING — US US PELVIS COMPLETE WITH TRANSVAGINAL
3 series · 15 of 25 positions shown · non-contrast
Comparison: None

CLINICAL DATA: Patient with dysmenorrhea.  Menorrhagia.



[Series 1: us pelvis complete with transvaginal · 2 of 13 slices shown (1 of 3)]
[im 1/13]
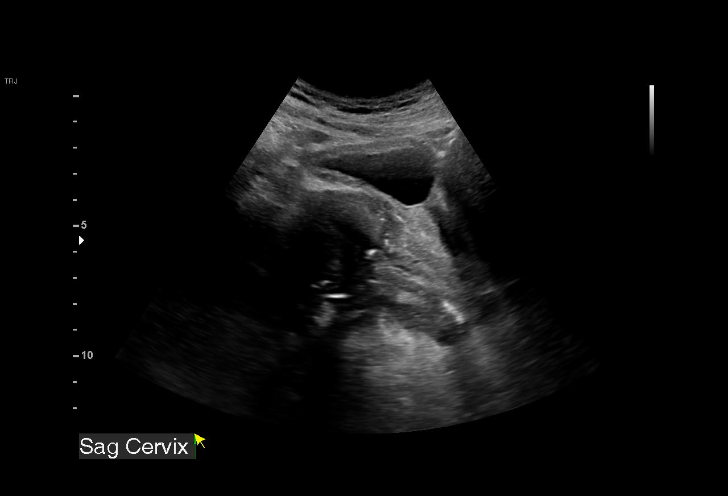
[im 13/13]
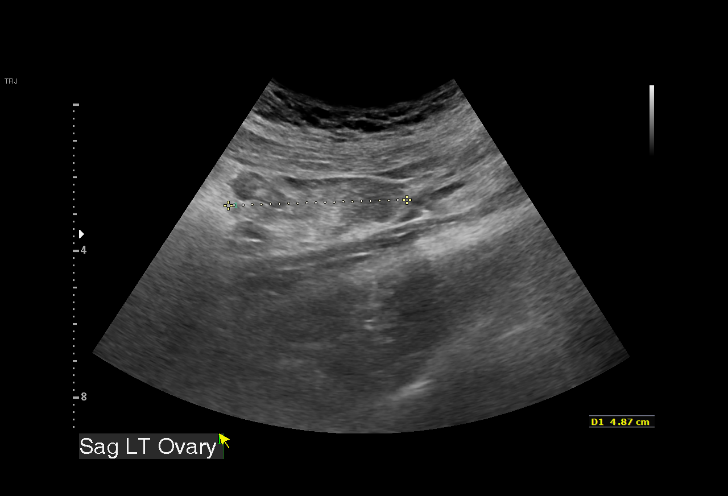

[Series 2: us pelvis complete with transvaginal · 12 of 69 slices shown (2 of 3)]
[im 4/69]
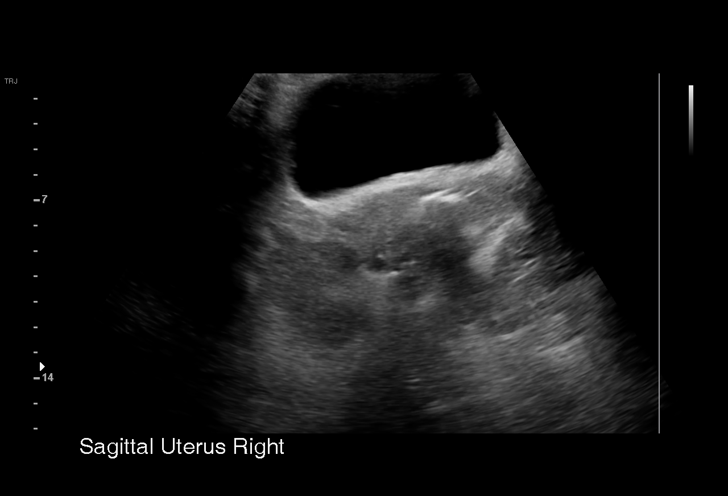
[im 7/69]
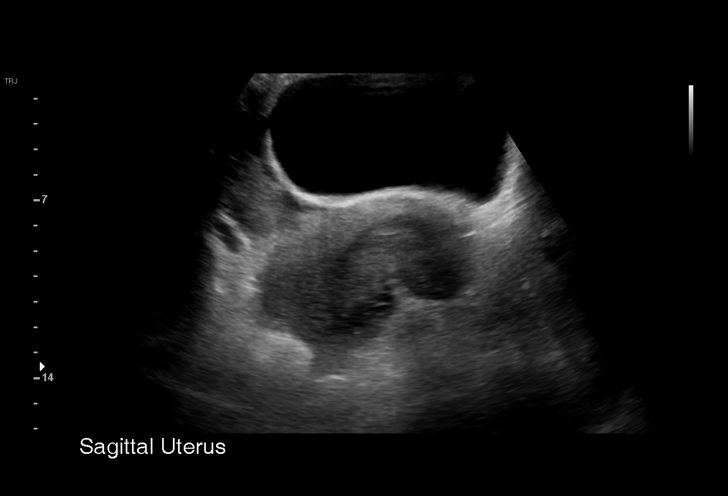
[im 14/69]
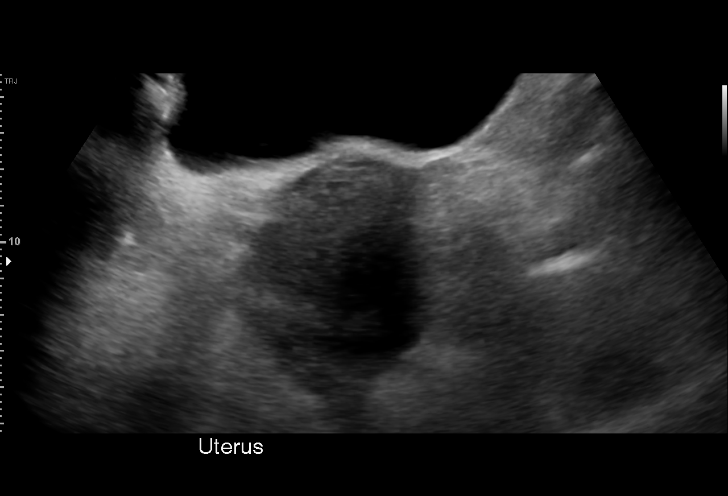
[im 21/69]
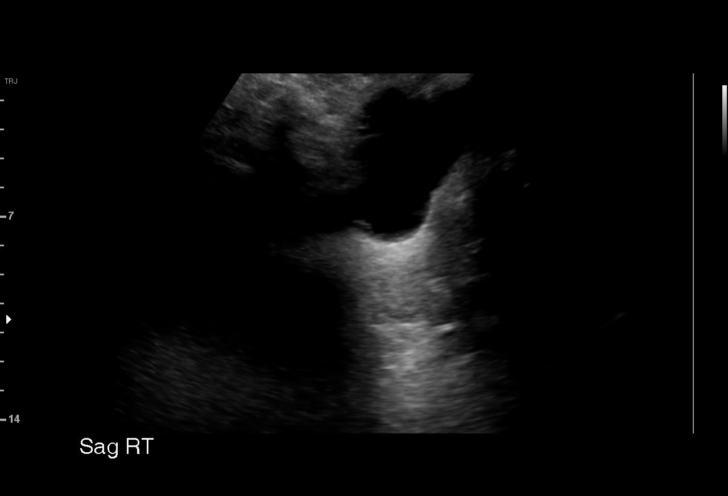
[im 24/69]
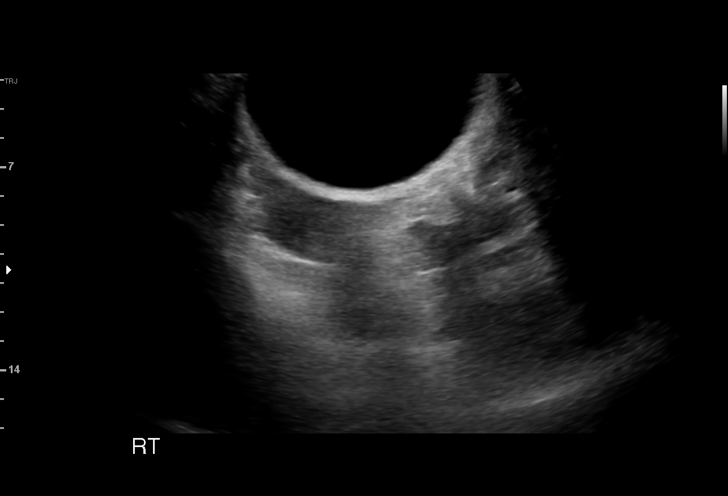
[im 31/69]
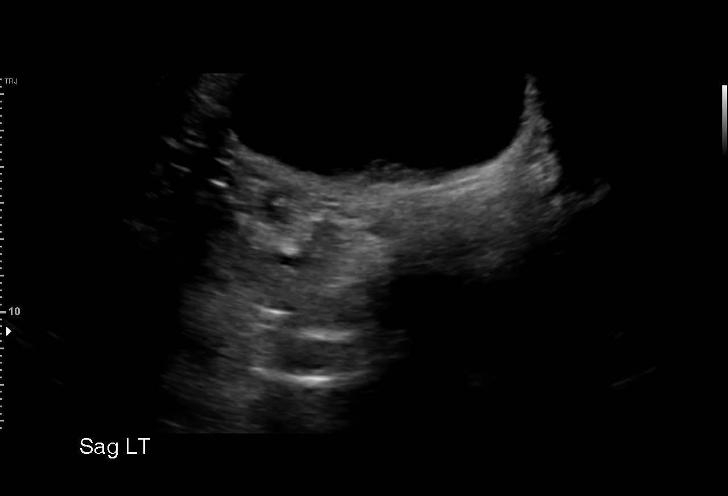
[im 38/69]
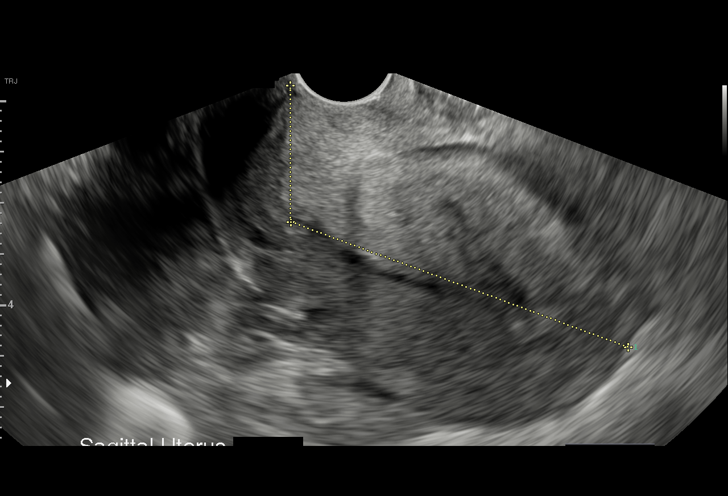
[im 41/69]
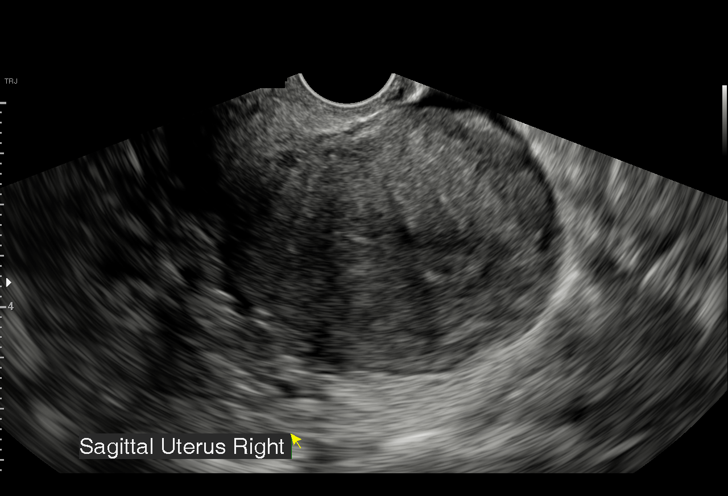
[im 48/69]
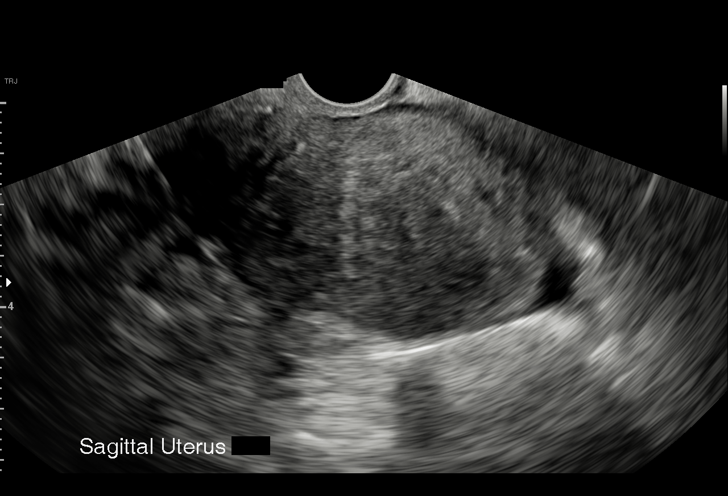
[im 55/69]
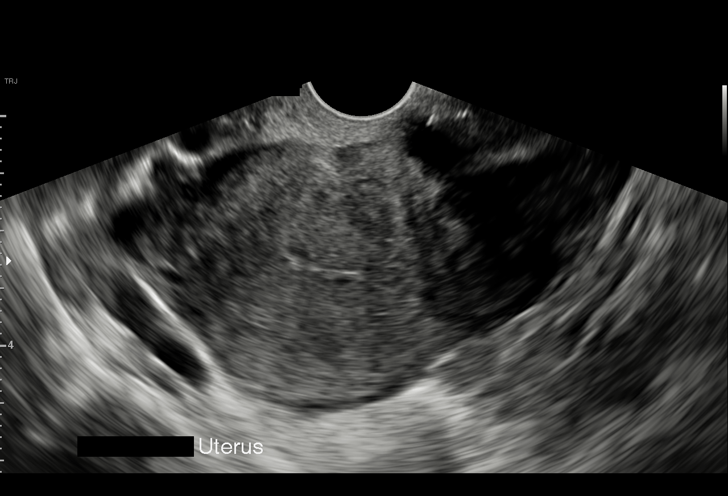
[im 58/69]
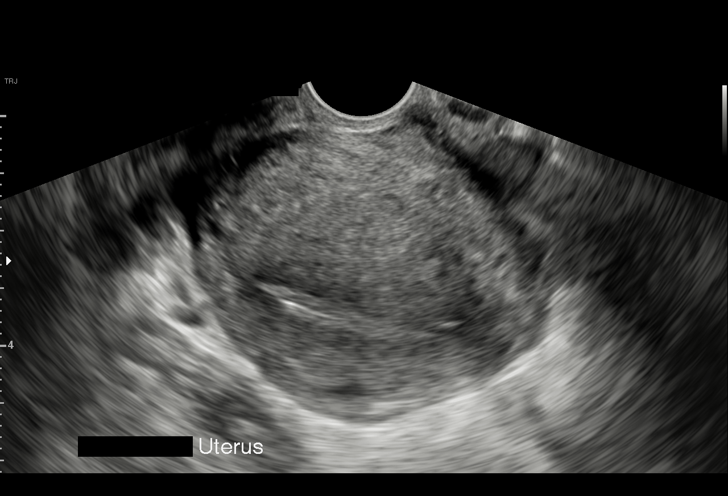
[im 65/69]
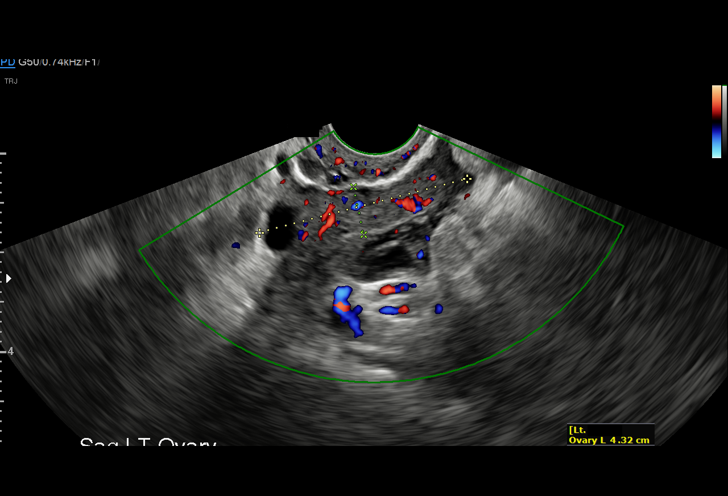

[Series 3: us pelvis complete with transvaginal · 1 of 1 slices shown (3 of 3)]
[im 1/1]
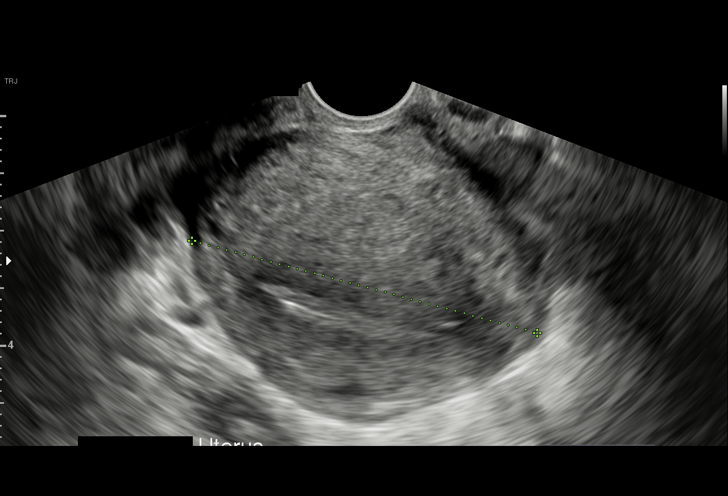

[15 of 25 positions shown; findings below may reference images not displayed]

FINDINGS: Uterus

Measurements: 9.7 x 5.3 x 6.2 cm = volume: 168 mL. The uterus is
retroverted. Heterogeneity of the myometrium with posterior acoustic
shadowing.

Endometrium

Thickness: 5.5 mm.  No focal abnormality visualized.

Right ovary

Measurements: 3.7 x 2.3 x 2.4 cm = volume: 10.7 mL. Normal
appearance/no adnexal mass. Corpus luteum.

Left ovary

Measurements: 4.3 x 1.0 x 2.3 cm = volume: 5.1 mL. Normal
appearance/no adnexal mass.

Other findings

Small right adnexal free fluid.
IMPRESSION: Heterogeneity of the myometrium raising the possibility of
adenomyosis.

Endometrium measures 5 mm. If bleeding remains unresponsive to
hormonal or medical therapy, sonohysterogram should be considered
for focal lesion work-up. (Ref: Radiological Reasoning: Algorithmic
Workup of Abnormal Vaginal Bleeding with Endovaginal Sonography and
Sonohysterography. [id number]; [id number])

## 2021-03-30 NOTE — Progress Notes (Signed)
    SUBJECTIVE:   CHIEF COMPLAINT / HPI:   PAP smear - last PAP 02/25/19, found to be NILM with negative STI screening - history total lap hysterectomy with left salpingectomy for dysmenorrhea 06/30/2019 with Dr. Aletha Halim  STI screening - declined screening for GC/CH, trich, HIV, and Hep C  Health Maintenance - due for COVID, flu, and TDaP vaccines - accepts TDaP today - declines flu, COVID  PERTINENT  PMH / PSH: TMJ, total hysterectomy, unilateral salpingectomy  OBJECTIVE:   BP 130/87   Pulse 78   Ht 5\' 5"  (1.651 m)   Wt 140 lb 3.2 oz (63.6 kg)   LMP 06/18/2019   SpO2 100%   BMI 23.33 kg/m   Physical Exam General: Awake, alert, oriented, no acute distress Respiratory: Normal work of breathing, no respiratory distress Neuro: Cranial nerves II through X grossly intact, able to move all extremities spontaneously Vulva: Normal appearing vulva without rashes, lesions, or deformities Vagina: Pale pink rugated vaginal tissue without obvious lesions, physiologic discharge of whitish color, vagina ends in blind pouch c/w known surgical history  ASSESSMENT/PLAN:   Pap smear for cervical cancer screening PAP smear collected today. Last PAP 02/25/19, found to be NILM with negative STI screening. History total lap hysterectomy with left salpingectomy for dysmenorrhea 06/30/2019 with Dr. Aletha Halim. Patient declines STI screening today. Will update patient with results.   Healthcare maintenance Patient declines flu and COVID vaccines. She is amenable to flu vaccine, given today. Patient has annual physical exam form for her workplace; it requires current lipid panel and fasting glucose measurement. Will obtain today.      Ezequiel Essex, MD [place]

## 2021-03-30 NOTE — Patient Instructions (Signed)
It was wonderful to see you today. Thank you for allowing me to be a part of your care. Below is a short summary of what we discussed at your visit today:  PAP smear If the results are normal, I will send you a letter or MyChart message. If the results are abnormal, I will give you a call.    Vaccines Today you received the Tdap vaccine.  You may experience some residual soreness at the injection site.  Gentle stretches and regular use of that arm will help speed up your recovery.  As the vaccines are giving your immune system a "practice run" against specific infections, you may feel a little under the weather for the next several days.  We recommend rest as needed and hydrating.  Today you declined the annual flu vaccine and COVID vaccine.  If you change your mind at any time, you may simply call the clinic and make a vaccine only appointment with a nurse at your convenience.  We recommend completing the two-shot COVID vaccine once and also getting your flu shot every year.  While it does not fully prevent you from getting the flu or COVID, it does reduce symptom severity and keep you out of the hospital.  Food getting stuck in throat - I will refer you to an ENT doctor (ear, nose, throat) for evaluation - you should hear directly from the ENT office in 1-2 weeks to set up an appointment - please let us know if you do not hear from them within 2 weeks  Please bring all of your medications to every appointment!  If you have any questions or concerns, please do not hesitate to contact us via phone or MyChart message.   Ezequiel Essex, MD

## 2021-03-31 ENCOUNTER — Encounter: Payer: Self-pay | Admitting: Family Medicine

## 2021-03-31 ENCOUNTER — Ambulatory Visit (INDEPENDENT_AMBULATORY_CARE_PROVIDER_SITE_OTHER): Payer: Commercial Managed Care - PPO | Admitting: Family Medicine

## 2021-03-31 ENCOUNTER — Other Ambulatory Visit (HOSPITAL_COMMUNITY)
Admission: RE | Admit: 2021-03-31 | Discharge: 2021-03-31 | Disposition: A | Discharge status: Home / Self Care | Payer: Commercial Managed Care - PPO | Source: Ambulatory Visit | Attending: Family Medicine | Admitting: Family Medicine

## 2021-03-31 ENCOUNTER — Other Ambulatory Visit: Payer: Self-pay

## 2021-03-31 VITALS — BP 130/87 | HR 78 | Ht 65.0 in | Wt 140.2 lb

## 2021-03-31 DIAGNOSIS — Z0000 Encounter for general adult medical examination without abnormal findings: Secondary | ICD-10-CM

## 2021-03-31 DIAGNOSIS — Z113 Encounter for screening for infections with a predominantly sexual mode of transmission: Secondary | ICD-10-CM

## 2021-03-31 DIAGNOSIS — R09A2 Foreign body sensation, throat: Secondary | ICD-10-CM

## 2021-03-31 DIAGNOSIS — Z9079 Acquired absence of other genital organ(s): Secondary | ICD-10-CM

## 2021-03-31 DIAGNOSIS — Z131 Encounter for screening for diabetes mellitus: Secondary | ICD-10-CM

## 2021-03-31 DIAGNOSIS — Z1159 Encounter for screening for other viral diseases: Secondary | ICD-10-CM | POA: Insufficient documentation

## 2021-03-31 DIAGNOSIS — Z23 Encounter for immunization: Secondary | ICD-10-CM

## 2021-03-31 DIAGNOSIS — Z13220 Encounter for screening for lipoid disorders: Secondary | ICD-10-CM

## 2021-03-31 DIAGNOSIS — W44F3XA Food entering into or through a natural orifice, initial encounter: Secondary | ICD-10-CM

## 2021-03-31 DIAGNOSIS — Z124 Encounter for screening for malignant neoplasm of cervix: Secondary | ICD-10-CM | POA: Insufficient documentation

## 2021-03-31 DIAGNOSIS — R0989 Other specified symptoms and signs involving the circulatory and respiratory systems: Secondary | ICD-10-CM

## 2021-03-31 DIAGNOSIS — T17320A Food in larynx causing asphyxiation, initial encounter: Secondary | ICD-10-CM

## 2021-03-31 DIAGNOSIS — R198 Other specified symptoms and signs involving the digestive system and abdomen: Secondary | ICD-10-CM

## 2021-03-31 DIAGNOSIS — Z114 Encounter for screening for human immunodeficiency virus [HIV]: Secondary | ICD-10-CM

## 2021-03-31 LAB — POCT CBG (FASTING - GLUCOSE)-MANUAL ENTRY: Glucose Fasting, POC: 110 mg/dL — AB (ref 70–99)

## 2021-03-31 NOTE — Assessment & Plan Note (Addendum)
Patient declines flu and COVID vaccines. She is amenable to flu vaccine, given today. Patient has annual physical exam form for her workplace; it requires current lipid panel and fasting glucose measurement. Will obtain today.

## 2021-03-31 NOTE — Assessment & Plan Note (Signed)
PAP smear collected today. Last PAP 02/25/19, found to be NILM with negative STI screening. History total lap hysterectomy with left salpingectomy for dysmenorrhea 06/30/2019 with Dr. Aletha Halim. Patient declines STI screening today. Will update patient with results.

## 2021-04-01 LAB — LIPID PANEL
Chol/HDL Ratio: 2.3 ratio (ref 0.0–4.4)
Cholesterol, Total: 179 mg/dL (ref 100–199)
HDL: 78 mg/dL (ref >39)
LDL Chol Calc (NIH): 91 mg/dL (ref 0–99)
Triglycerides: 48 mg/dL (ref 0–149)
VLDL Cholesterol Cal: 10 mg/dL (ref 5–40)

## 2021-04-04 LAB — CYTOLOGY - PAP: Diagnosis: NEGATIVE

## 2021-12-05 ENCOUNTER — Encounter: Payer: Self-pay | Admitting: *Deleted

## 2023-01-04 ENCOUNTER — Encounter: Payer: Commercial Managed Care - PPO | Admitting: Family Medicine

## 2023-03-05 ENCOUNTER — Ambulatory Visit (INDEPENDENT_AMBULATORY_CARE_PROVIDER_SITE_OTHER): Payer: BC Managed Care – PPO | Admitting: Family Medicine

## 2023-03-05 ENCOUNTER — Other Ambulatory Visit: Payer: Self-pay

## 2023-03-05 ENCOUNTER — Encounter: Payer: Self-pay | Admitting: Family Medicine

## 2023-03-05 VITALS — BP 150/84 | HR 101 | Ht 64.0 in | Wt 137.6 lb

## 2023-03-05 DIAGNOSIS — R21 Rash and other nonspecific skin eruption: Secondary | ICD-10-CM | POA: Diagnosis not present

## 2023-03-05 MED ORDER — TRIAMCINOLONE ACETONIDE 0.025 % EX OINT: 1.0000 | TOPICAL_OINTMENT | Freq: Two times a day (BID) | CUTANEOUS | 1 refills | Status: DC

## 2023-03-05 NOTE — Progress Notes (Signed)
   SUBJECTIVE:   CHIEF COMPLAINT / HPI:  Anna Bradley is a 47 y.o. female with a pertinent past medical history of eczema presenting to the clinic for acute onset skin rash.  Skin rash For the past 5-6 days, patient has noticed dry, severely itchy bumps on bilateral arms, upper chest, back of neck. Does have some possible photosensitivity or itching with sweating, is sensitive with cloth on skin. Showering does not seem to be a problem, drying off with towel is unpleasant.  Cold water may help a bit.  Possibly worsening, progressing. Tried Benadryl cream, did not help (even with itching). No history of family food allergies, is allergic to berries, did have eczema as a child and has "sensitive skin."  No change in toiletries, detergents, creams, soaps, etc. No fevers, chills, arthralgias, systemic symptoms. 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  PERTINENT PMH / PSH: Eczema, allergic to multiple berries   OBJECTIVE:   BP (!) 150/84   Pulse (!) 101   Ht 5\' 4"  (1.626 m)   Wt 137 lb 9.6 oz (62.4 kg)   LMP 06/18/2019   SpO2 100%   BMI 23.62 kg/m   General: Age-appropriate, resting comfortably in chair, NAD, alert and at baseline. HEENT: MMM. Skin: Flesh-colored, smooth papular and nonscaling rash on bilateral arms, upper chest, posterior neck. Located on flexural surfaces of elbows, but not uniquely so.  Some dryness and surrounding excoriations.  No erythema, edema, drainage, confluence.  See photos below. Extremities: Capillary refill <2 seconds.          ASSESSMENT/PLAN:   Rash and nonspecific skin eruption While differential for rash remains broad, given patient's history of eczema and appearance of rash, do not suspect infectious etiology.  Rather, more likely atopic dermatitis exacerbated by irritant or a related condition.  Will treat empirically  with steroids and observe for improvement. -Triamcinolone 0.025% BID 5 to 7 days -Recommended frequent emollient use -Return precautions provided if rash does not improve or worsens  Return if symptoms worsen or fail to improve.  Anna Bradley Sharion Dove, MD [facility]

## 2023-03-05 NOTE — Assessment & Plan Note (Signed)
While differential for rash remains broad, given patient's history of eczema and appearance of rash, do not suspect infectious etiology.  Rather, more likely atopic dermatitis exacerbated by irritant or a related condition.  Will treat empirically with steroids and observe for improvement. -Triamcinolone 0.025% BID 5 to 7 days -Recommended frequent emollient use -Return precautions provided if rash does not improve or worsens

## 2023-03-05 NOTE — Patient Instructions (Signed)
It was great to see you today! Thank you for choosing [facility] for your primary care.  Today we addressed: Skin rash: I believe this rash is skin irritation, but it is difficult to tell what caused it.  We will try a steroid cream to clear it up.  Please use the Kenalog cream 2 times a day for 5-7 days.  Also use an emollient like Vaseline throughout the day to keep the skin from getting dry.  You should return to our clinic in a week if the rash does not improve or immediately if it gets worse.  Thank you for coming to see [place] at [facility] and for the opportunity to care for you! Anna Bradley, Anna Rutan, MD 03/05/2023, 2:19 PM

## 2023-03-06 ENCOUNTER — Ambulatory Visit: Payer: Self-pay

## 2023-10-06 ENCOUNTER — Ambulatory Visit (HOSPITAL_COMMUNITY): Admission: EM | Admit: 2023-10-06 | Discharge: 2023-10-06 | Disposition: A | Discharge status: Home / Self Care

## 2023-10-06 ENCOUNTER — Encounter (HOSPITAL_COMMUNITY): Payer: Self-pay | Admitting: Emergency Medicine

## 2023-10-06 DIAGNOSIS — L301 Dyshidrosis [pompholyx]: Secondary | ICD-10-CM | POA: Diagnosis not present

## 2023-10-06 MED ORDER — MOMETASONE FUROATE 0.1 % EX OINT: TOPICAL_OINTMENT | Freq: Every day | CUTANEOUS | 0 refills | Status: DC

## 2023-10-06 NOTE — ED Triage Notes (Addendum)
 Patient was cleaning up yard debris in January.  There was a cut, improved with home maintenance care and improved.  Scabbing itched.  Since January, the right palm has not completely gone away.  It has always been in some state of itching, dry, scabbing  currently, a small cut remains, dark skin around "cut", blisters, itching.  Patient is right handed  [facility] went [facility], received antibiotic and a cream.  Symptoms have not improved-now has blisters to right palm of hand  MEDICINES RECORDED IN MED LIST

## 2023-10-06 NOTE — Discharge Instructions (Addendum)
 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  Keep the area clean with gentle soap and wash your hands in lukewarm water.  If you are going to use any type of latex glove apply a cotton glove underneath it.  Try to avoid keeping your hand in water and make sure to pat dry any time your hands are wet.  You can continue allergy medication such as cetirizine daily.  Follow-up with dermatology if your symptoms are not improving.  If you have any signs of infection including redness, drainage, swelling you need to be seen immediately.

## 2023-10-06 NOTE — ED Provider Notes (Signed)
 [facility]    CSN: [id number] Arrival date & time: 10/06/23  1007      History   Chief Complaint No chief complaint on file.   HPI Anna Bradley is a 48 y.o. female.   Patient presents today with a several month history of wound to her right palm.  Reports that she was working in the yard when she pulled a cut that caused a small wound in the palm of her hand.  She had been treating this at home with topical antibiotics and keeping it clean.  Over the past several weeks she has developed some blistering and intense pruritus.  She was seen at a different urgent care where she was prescribed cephalexin and clotrimazole.  This has been ineffective in managing her symptoms.  She denies any fever, nausea, vomiting, drainage.  She is right-handed.  Denies any numbness or paresthesias.  She does have a history of eczema but manages this with oral antihistamines and over-the-counter emollients.  She has not recently seen a dermatologist.  She denies any regular submersion of her hand in water.  She denies any associated pain only pruritus.    Past Medical History:  Diagnosis Date   ANEMIA, IRON DEFICIENCY, UNSPEC. 08/29/2006   Qualifier: Diagnosis of  By: Knox Royalty     ANEMIA, IRON DEFICIENCY, UNSPEC. 08/29/2006   Qualifier: Diagnosis of  By: Knox Royalty     ASCUS (atypical squamous cells of undetermined significance) on Pap smear 1999   Dysmenorrhea 02/26/2019   Ectopic pregnancy 2000   Menorrhagia 2005   tx with ocps   MENORRHAGIA 08/29/2006   Qualifier: Diagnosis of  By: Jorje Guild SMEAR, ABNORMAL 08/29/2006   Qualifier: Diagnosis of By: Knox Royalty     Trichomonas 2008    Patient Active Problem List   Diagnosis Date Noted   Rash and nonspecific skin eruption 03/05/2023   History of unilateral salpingectomy 03/31/2021   TMJ (temporomandibular joint disorder) 07/28/2020   Pap smear for cervical cancer screening 03/20/2020   History of  total hysterectomy 06/30/2019   Healthcare maintenance 09/04/2013    Past Surgical History:  Procedure Laterality Date    etopic pregnancy     CYSTOSCOPY N/A 06/30/2019   Procedure: CYSTOSCOPY;  Surgeon: Halsey Bing, MD;  Location: [facility] OR;  Service: Gynecology;  Laterality: N/A;   TOTAL LAPAROSCOPIC HYSTERECTOMY WITH SALPINGECTOMY Bilateral 06/30/2019   Procedure: TOTAL LAPAROSCOPIC HYSTERECTOMY WITH Left SALPINGECTOMY;  Surgeon: [place] Bing, MD;  Location: [facility] OR;  Service: Gynecology;  Laterality: Bilateral;   TUBAL LIGATION  2003   UNILATERAL SALPINGECTOMY Right    ex-lap, RS for ectopic   UNILATERAL SALPINGECTOMY Left    postpartum    OB History     Gravida  4   Para  3   Term  3   Preterm      AB  1   Living  3      SAB      IAB      Ectopic  1   Multiple      Live Births           Obstetric Comments  SVD x 3.           Home Medications    Prior to Admission medications   Medication Sig Start Date End Date Taking? Authorizing Provider  cephALEXin (KEFLEX) 500 MG capsule Take 500 mg by mouth 3 (three) times daily. FINISHED RECENTLY-LAST DOSE YESTERDAY 09/27/23  Yes [provider]  clotrimazole (LOTRIMIN) 1 % cream Apply topically 2 (two) times daily. 09/27/23  Yes [provider]  mometasone (ELOCON) 0.1 % ointment Apply topically daily. 10/06/23  Yes Naseer Hearn, Noberto Retort, PA-C    Family History Family History  Problem Relation Age of Onset   Hypertension Mother    Colon polyps Neg Hx    Colon cancer Neg Hx    Stomach cancer Neg Hx    Rectal cancer Neg Hx    Esophageal cancer Neg Hx     Social History Social History   Tobacco Use   Smoking status: Never   Smokeless tobacco: Never  Vaping Use   Vaping status: Never Used  Substance Use Topics   Alcohol use: Yes    Alcohol/week: 1.0 standard drink of alcohol    Types: 1 Glasses of wine per week   Drug use: No     Allergies   Patient has no known  allergies.   Review of Systems Review of Systems  Constitutional:  Negative for activity change, appetite change, fatigue and fever.  Gastrointestinal:  Negative for abdominal pain, diarrhea, nausea and vomiting.  Musculoskeletal:  Negative for arthralgias and myalgias.  Skin:  Positive for rash and wound. Negative for color change.  Neurological:  Negative for weakness and numbness.     Physical Exam Triage Vital Signs ED Triage Vitals [10/06/23 1100]  Encounter Vitals Group     BP (!) 151/97     Systolic BP Percentile      Diastolic BP Percentile      Pulse Rate 86     Resp 18     Temp 98.1 F (36.7 C)     Temp Source Oral     SpO2 98 %     Weight      Height      Head Circumference      Peak Flow      Pain Score      Pain Loc      Pain Education      Exclude from Growth Chart    No data found.  Updated Vital Signs BP (!) 151/97 (BP Location: Left Arm)   Pulse 86   Temp 98.1 F (36.7 C) (Oral)   Resp 18   LMP 06/18/2019   SpO2 98%   Visual Acuity Right Eye Distance:   Left Eye Distance:   Bilateral Distance:    Right Eye Near:   Left Eye Near:    Bilateral Near:     Physical Exam Vitals reviewed.  Constitutional:      General: She is awake. She is not in acute distress.    Appearance: Normal appearance. She is well-developed. She is not ill-appearing.     Comments: Very pleasant female appears stated age in no acute distress sitting comfortably in exam room  HENT:     Head: Normocephalic and atraumatic.  Cardiovascular:     Rate and Rhythm: Normal rate and regular rhythm.     Heart sounds: Normal heart sounds, S1 normal and S2 normal. No murmur heard. Pulmonary:     Effort: Pulmonary effort is normal.     Breath sounds: Normal breath sounds. No wheezing, rhonchi or rales.     Comments: Clear to auscultation bilaterally Musculoskeletal:     Right hand: No swelling, tenderness or bony tenderness. Normal range of motion. There is no disruption of  two-point discrimination. Normal capillary refill.  Skin:    Findings: Rash present. Rash  is macular, papular and vesicular.     Comments: Maculopapular with hyperpigmentation and central maceration with surrounding vesicles noted right palm.  Psychiatric:        Behavior: Behavior is cooperative.      UC Treatments / Results  Labs (all labs ordered are listed, but only abnormal results are displayed) Labs Reviewed - No data to display  EKG   Radiology No results found.  Procedures Procedures (including critical care time)  Medications Ordered in UC Medications - No data to display  Initial Impression / Assessment and Plan / UC Course  I have reviewed the triage vital signs and the nursing notes.  Pertinent labs & imaging results that were available during my care of the patient were reviewed by me and considered in my medical decision making (see chart for details).     Patient is well-appearing, afebrile, nontoxic, nontachycardic.  We discussed that symptoms are most consistent with dyshidrotic eczema particularly given intense pruritus and a lateral presentation.  I suspect this was triggered by the injury several months ago.  No signs of infection that would warrant additional antibiotics.  We did discuss potentially utility of blood work including RPR, however, patient had no concern for this as contributing factor so blood work was deferred for the time being.  We discussed general management strategies including avoiding prolonged exposure to moisture, washing hands and lukewarm water, using a cotton glove under vinyl or latex glove, keeping hand dry.  If her symptoms are not improving with current treatment strategies she was encouraged to follow-up with dermatology and given the contact information for local provider with instruction to call to schedule an appointment as needed.  If anything worsens and she has signs of infection or spread of symptoms she is to return for  reevaluation.  Strict return precautions given.  All questions answered to patient satisfaction.  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  Keep the area clean with gentle soap and wash your hands in lukewarm water.  If you are going to use any type of latex glove apply a cotton glove underneath it.  Try to avoid keeping your hand in water and make sure to pat dry any time your hands are wet.  You can continue allergy medication such as cetirizine daily.  Follow-up with dermatology if your symptoms are not improving.  If you have any signs of infection including redness, drainage, swelling you need to be seen immediately.    ED Prescriptions     Medication Sig Dispense Auth. Provider   mometasone (ELOCON) 0.1 % ointment Apply topically daily. 45 g Jayonna Meyering K, PA-C      PDMP not reviewed this encounter.   Jeani Hawking, PA-C 10/06/23 1153

## 2023-10-07 ENCOUNTER — Telehealth (HOSPITAL_COMMUNITY): Payer: Self-pay

## 2023-10-07 MED ORDER — MOMETASONE FUROATE 0.1 % EX OINT: TOPICAL_OINTMENT | Freq: Every day | CUTANEOUS | 0 refills | Status: DC

## 2023-10-07 NOTE — Telephone Encounter (Signed)
 Pt reports preferred pharmacy did not have rx in stock. Pharmacy changed per pt request.

## 2023-10-29 ENCOUNTER — Telehealth: Payer: Self-pay

## 2023-10-29 ENCOUNTER — Ambulatory Visit: Payer: Self-pay | Admitting: Student

## 2023-10-29 ENCOUNTER — Other Ambulatory Visit: Payer: Self-pay | Admitting: Student

## 2023-10-29 VITALS — BP 126/80 | Ht 64.0 in | Wt 136.8 lb

## 2023-10-29 DIAGNOSIS — L301 Dyshidrosis [pompholyx]: Secondary | ICD-10-CM

## 2023-10-29 MED ORDER — TACROLIMUS 0.03 % EX OINT: TOPICAL_OINTMENT | Freq: Two times a day (BID) | CUTANEOUS | 0 refills | Status: DC

## 2023-10-29 MED ORDER — CLOBETASOL PROPIONATE 0.05 % EX OINT: 1.0000 | TOPICAL_OINTMENT | Freq: Two times a day (BID) | CUTANEOUS | 0 refills | Status: DC

## 2023-10-29 NOTE — Patient Instructions (Signed)
 It was great to see you today! Thank you for choosing [facility] for your primary care.  Today we addressed: This is dyshidrotic eczema although unfortunately the medication that the urgent care gave you did not work.  We are going to try tacrolimus ointment.  If this does not improve after 4 weeks, come on back to see me.  If you haven't already, sign up for My Chart to have easy access to your labs results, and communication with your primary care physician.  Return if symptoms worsen or fail to improve. Please arrive 15 minutes before your appointment to ensure smooth check in process.  We appreciate your efforts in making this happen.  Thank you for allowing me to participate in your care, Veronia Goon, DO 10/29/2023, 2:27 PM PGY-3, [facility]

## 2023-10-29 NOTE — Progress Notes (Signed)
  SUBJECTIVE:   CHIEF COMPLAINT / HPI:   Hand complaint: The rash on her right hand began a couple of months ago after yard work, initially presenting as an itchy scab that resolved and then recurred. She sought care at urgent care a month ago and was prescribed Keflex, Lotrimin, and mometasone . The steroid cream provided minimal improvement over 16 days. The rash is characterized by itchy, dry, painful, blister-like lesions on the palmar surface of the right hand, with no spread to other areas.  She denies fever or systemic symptoms and feels otherwise normal. 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 She occasionally uses Vaseline for dryness management.  Her eczema typically affects her elbows, and she avoids known triggers to manage it. This is her first experience with eczema on her hands.  PERTINENT  PMH / PSH: Hysterectomy, b/l salpingectomy  OBJECTIVE:  BP 126/80   Ht 5\' 4"  (1.626 m)   Wt 136 lb 12.8 oz (62.1 kg)   LMP 06/18/2019   BMI 23.48 kg/m  General: Well-appearing, NAD Skin: Thickened, blistering with several nonbleeding open blisters with severe lichenification and surrounding postinflammatory hyperpigmentation over the right palmar surface consistent with dyshidrotic eczema    ASSESSMENT/PLAN:   Assessment & Plan Dyshidrotic eczema No systemic symptoms such as fever are present. The condition is currently uncontrolled and requires further evaluation.  - Discontinue mometasone  cream to prevent further skin breakdown. - Initiate tacrolimus ointment x 4 weeks, return if not improving -Should this not improve, consider oral prednisone  course for severe dyshidrotic eczema Return if symptoms worsen or fail to improve. Veronia Goon, DO 10/29/2023, 2:29 PM PGY-3, [facility]

## 2023-10-29 NOTE — Assessment & Plan Note (Signed)
 No systemic symptoms such as fever are present. The condition is currently uncontrolled and requires further evaluation.  - Discontinue mometasone  cream to prevent further skin breakdown. - Initiate tacrolimus ointment x 4 weeks, return if not improving -Should this not improve, consider oral prednisone  course for severe dyshidrotic eczema

## 2023-10-29 NOTE — Telephone Encounter (Signed)
 Received call from patient regarding Tacrolimus prescription.   She reports that medication is going to cost ~ $200. Patient is not able to afford cost.   Called and spoke with pharmacist. They were not able to provide any additional information about alternatives or cost of medication. Recommended that I call insurance provider.   Spoke with representative at [employer]. I was advised that patient's plan does not cover prescription drugs. Patient is responsible for 100% of cost. I asked if there was a cheaper alternative within the same medication class. Representative states that Tacrolimus ointment is the cheapest within the class.   Will forward to provider for further advisement.   Elsie Halo, RN

## 2023-10-29 NOTE — Addendum Note (Signed)
 Addended by: Iantha Mainland on: 10/29/2023 02:34 PM   Modules accepted: Orders

## 2023-10-31 MED ORDER — CLOBETASOL PROPIONATE 0.05 % EX OINT: 1.0000 | TOPICAL_OINTMENT | Freq: Two times a day (BID) | CUTANEOUS | 0 refills | Status: DC

## 2023-10-31 NOTE — Addendum Note (Signed)
 Addended by: Avangeline Stockburger C on: 10/31/2023 11:31 AM   Modules accepted: Orders

## 2023-10-31 NOTE — Telephone Encounter (Signed)
 Patient returns call to nurse line. Advised of prescription change.   Patient will call back to office if there are any issues with picking this up.   Elsie Halo, RN

## 2023-10-31 NOTE — Telephone Encounter (Signed)
 Patient returns call to nurse line. Rx was supposed to go to [facility] on [address].   Called and cancelled at [facility].   Resent to [facility] on [address].  Called and spoke with pharmacist regarding cost.   Cost of rx is $17.90. Called patient with update. Patient appreciative.   Elsie Halo, RN

## 2023-10-31 NOTE — Telephone Encounter (Signed)
 Attempted to call patient to provide with update.   She did not answer, LVM asking that patient call back to office.   Elsie Halo, RN

## 2024-01-13 ENCOUNTER — Ambulatory Visit: Payer: PRIVATE HEALTH INSURANCE | Admitting: Dermatology

## 2024-01-13 ENCOUNTER — Encounter: Payer: Self-pay | Admitting: Dermatology

## 2024-01-13 VITALS — BP 136/84

## 2024-01-13 DIAGNOSIS — L308 Other specified dermatitis: Secondary | ICD-10-CM | POA: Diagnosis not present

## 2024-01-13 DIAGNOSIS — R21 Rash and other nonspecific skin eruption: Secondary | ICD-10-CM | POA: Diagnosis not present

## 2024-01-13 DIAGNOSIS — Z139 Encounter for screening, unspecified: Secondary | ICD-10-CM

## 2024-01-13 NOTE — Patient Instructions (Addendum)

## 2024-01-13 NOTE — Progress Notes (Signed)
   New Patient Visit   Subjective  Anna Bradley is a 48 y.o. female who presents for a NEW PATIENT appointment to be examined for the concerns as listed below.   Hand Eczema: Located at the R hand that presented January 2025 after doing yard work and would like to have it examined. The area are itchy  - rating it 8 out of 10. She has previously been treated for these areas by PCP and received referral to derm and was Rx [facility], Clotrimazole 1% cream & Clobetasol  0.05% oint- topicals helped but not resolve issue.   Patient denied Hx of Bx. Patient denied family Hx of skin cancer.   The following portions of the chart were reviewed this encounter and updated as appropriate: medications, allergies, medical history  Review of Systems:  No other skin or systemic complaints except as noted in HPI or Assessment and Plan.  Objective  Well appearing patient in no apparent distress; mood and affect are within normal limits.   A focused examination was performed of the following areas: R hand   Relevant exam findings are noted in the Assessment and Plan.        Assessment & Plan   Palm Rash - Assessment: Patient presents with a palm rash that began in January as a small spot with intense itching (10/10). The rash has been refractory to multiple treatments, including clotrimazole, clobetasol , triamcinolone , and oral antibiotics. Clobetasol  provides temporary relief from itching but causes bubbling and scabbing. The rash recurs upon discontinuation of clobetasol . Physical examination reveals pitting in the nails. The differential diagnosis includes psoriasis, tinea, and eczema, with psoriasis being the leading consideration due to the isolated location, age of onset, and potential triggering by trauma (patient is right-hand dominant). The thick skin on the palms may be limiting the effectiveness of topical treatments.  - Plan:    Perform shave biopsy today to differentiate between  psoriasis and eczema     - Informed consent obtained: patient advised of numbing process and potential discomfort    Continue clobetasol  ointment daily for symptomatic relief    Order laboratory tests:     - Quantiferon TB test     - Complete Blood Count (CBC)     - Comprehensive Metabolic Panel (CMP)     - Lipid profile    Post-biopsy care instructions:     - Apply Vaseline and keep the biopsy site covered with a Band-Aid  RASH Right Hand - Anterior Skin / nail biopsy - Right Hand - Anterior  No follow-ups on file.   Documentation: I have reviewed the above documentation for accuracy and completeness, and I agree with the above.  I, Shirron Maranda, CMA, am acting as scribe for [employer], DO.   Delon Lenis, DO

## 2024-01-15 LAB — SURGICAL PATHOLOGY

## 2024-01-16 ENCOUNTER — Ambulatory Visit: Payer: Self-pay | Admitting: Dermatology

## 2024-01-29 ENCOUNTER — Encounter: Payer: Self-pay | Admitting: Dermatology

## 2024-01-29 ENCOUNTER — Ambulatory Visit: Payer: PRIVATE HEALTH INSURANCE | Admitting: Dermatology

## 2024-01-29 DIAGNOSIS — L301 Dyshidrosis [pompholyx]: Secondary | ICD-10-CM

## 2024-01-29 MED ORDER — CLOBETASOL PROPIONATE 0.05 % EX OINT: 1.0000 | TOPICAL_OINTMENT | Freq: Two times a day (BID) | CUTANEOUS | 3 refills | Status: AC

## 2024-01-29 NOTE — Progress Notes (Signed)
   Follow-Up Visit   Subjective  Anna Bradley is a 48 y.o. female who presents for the following: Biopsy proven hand eczema of right hand - She is using Clobetasol  ointment twice daily. She feels like she is a little better. Itching is much less and clobetasol  helps control it.   The following portions of the chart were reviewed this encounter and updated as appropriate: medications, allergies, medical history  Review of Systems:  No other skin or systemic complaints except as noted in HPI or Assessment and Plan.  Objective  Well appearing patient in no apparent distress; mood and affect are within normal limits.   A focused examination was performed of the following areas: Right hand  Relevant exam findings are noted in the Assessment and Plan.  FINAL DIAGNOSIS and MICROSCOPIC DESCRIPTION  Diagnosis Skin , right hand - anterior SPONGIOTIC DERMATITIS CONSISTENT WITH DYSHIDROTIC ECZEMA, SEE DESCRIPTION  Microscopic Description There is a spongiotic reaction pattern with parakeratosis with spongiosis, exocytosis, and a perivascular inflammatory reaction. Though this is compatible with a dyshidrotic dermatitis in the proper setting, a contact dermatitis and an id reaction are additional histologic considerations. Following review of the hematoxylin and eosin sections, a PAS stain was obtained to exclude a fungal infection. The PAS stain is negative for fungal organisms. Multiple sections have been examined. Melissa   Assessment & Plan   Hand Eczema - Assessment: Biopsy confirmed hand eczema, ruling out fungal infection or psoriasis. Patient has shown 80% improvement with current treatment. The condition is likely triggered by contact with an allergen. Differential diagnoses include contact dermatitis from latex, rubber, or cleaning supplies.  Pathology report reviewed with patient  - Plan:    Continue current treatment:     - Clobetasol  mixed with exfoliating  lotion containing urea     - Apply clobetasol  on hands for 1 more week (completing a 3-week course)     - Take a 2-week break from clobetasol  after completing the course to prevent skin thinning    Follow-up:     - Schedule appointment in 4 months to monitor for recurrence     - If symptoms recur, apply clobetasol  immediately    Preventive measures:     - Wear latex-free gloves, even when using mild cleaning products     - Avoid potential allergens like latex, rubber, or cleaning supplies    Future treatment options:     - Consider Dupixent (injectable for eczema) if clobetasol  becomes ineffective or inconvenient    Medication management:  DYSHIDROTIC ECZEMA   Related Medications clobetasol  ointment (TEMOVATE ) 0.05 % Apply 1 Application topically 2 (two) times daily.  Return in about 4 months (around 05/31/2024) for hand dermatitis.    Documentation: I have reviewed the above documentation for accuracy and completeness, and I agree with the above.  Delon Lenis, DO

## 2024-01-29 NOTE — Patient Instructions (Addendum)
 Date: Wed Jan 29 2024  Hello Anna Bradley,  Thank you for visiting today. Here is a summary of the key instructions:  Diagnosis: Hand Eczema versus Contact Dermatitis   - Medications:   - Continue using clobetasol  on your hands for 1 more week   - Mix clobetasol  with an exfoliating lotion containing urea   - After 3 weeks of use, take a 2-week break from clobetasol   - Skin Care:   - Wear latex-free gloves, even when using mild cleaners   - Avoid touching potential allergens like latex, rubber, or cleaning supplies  - Follow-up: Return for a follow-up appointment in 4 months  - If Symptoms Return:   - Apply clobetasol  immediately if hand eczema comes back   - Request more clobetasol  through MyChart if needed  - Future Treatment Option: Consider Dupixent (an injectable medication for eczema) as a future option if clobetasol  becomes ineffective or inconvenient  Please reach out if you have any questions or concerns.  Warm regards,  Dr. Delon Lenis, Dermatology  Important Information  Due to recent changes in healthcare laws, you may see results of your pathology and/or laboratory studies on MyChart before the doctors have had a chance to review them. We understand that in some cases there may be results that are confusing or concerning to you. Please understand that not all results are received at the same time and often the doctors may need to interpret multiple results in order to provide you with the best plan of care or course of treatment. Therefore, we ask that you please give us  2 business days to thoroughly review all your results before contacting the office for clarification. Should we see a critical lab result, you will be contacted sooner.   If You Need Anything After Your Visit  If you have any questions or concerns for your doctor, please call our main line at [phone] If no one answers, please leave a voicemail as directed and we will return your call as soon as  possible. Messages left after 4 pm will be answered the following business day.   You may also send us  a message via MyChart. We typically respond to MyChart messages within 1-2 business days.  For prescription refills, please ask your pharmacy to contact our office. Our fax number is [phone].  If you have an urgent issue when the clinic is closed that cannot wait until the next business day, you can page your doctor at the number below.    Please note that while we do our best to be available for urgent issues outside of office hours, we are not available 24/7.   If you have an urgent issue and are unable to reach us , you may choose to seek medical care at your doctor's office, retail clinic, urgent care center, or emergency room.  If you have a medical emergency, please immediately call 911 or go to the emergency department. In the event of inclement weather, please call our main line at [phone] for an update on the status of any delays or closures.  Dermatology Medication Tips: Please keep the boxes that topical medications come in in order to help keep track of the instructions about where and how to use these. Pharmacies typically print the medication instructions only on the boxes and not directly on the medication tubes.   If your medication is too expensive, please contact our office at [phone] or send us  a message through MyChart.   We are unable to tell  what your co-pay for medications will be in advance as this is different depending on your insurance coverage. However, we may be able to find a substitute medication at lower cost or fill out paperwork to get insurance to cover a needed medication.   If a prior authorization is required to get your medication covered by your insurance company, please allow us  1-2 business days to complete this process.  Drug prices often vary depending on where the prescription is filled and some pharmacies may offer cheaper  prices.  The website www.goodrx.com contains coupons for medications through different pharmacies. The prices here do not account for what the cost may be with help from insurance (it may be cheaper with your insurance), but the website can give you the price if you did not use any insurance.  - You can print the associated coupon and take it with your prescription to the pharmacy.  - You may also stop by our office during regular business hours and pick up a GoodRx coupon card.  - If you need your prescription sent electronically to a different pharmacy, notify our office through [facility] or by phone at [phone]

## 2024-06-04 ENCOUNTER — Ambulatory Visit: Payer: PRIVATE HEALTH INSURANCE | Admitting: Dermatology

## 2024-08-17 ENCOUNTER — Encounter: Payer: Self-pay | Admitting: Family Medicine
